# Patient Record
Sex: Female | Born: 1965
Health system: Southern US, Community
[De-identification: ages and names within clinical notes are randomized; demographics above are authoritative.]

## PROBLEM LIST (undated history)

## (undated) DIAGNOSIS — E559 Vitamin D deficiency, unspecified: Secondary | ICD-10-CM

## (undated) DIAGNOSIS — I10 Essential (primary) hypertension: Secondary | ICD-10-CM

## (undated) DIAGNOSIS — I2510 Atherosclerotic heart disease of native coronary artery without angina pectoris: Secondary | ICD-10-CM

## (undated) DIAGNOSIS — A64 Unspecified sexually transmitted disease: Secondary | ICD-10-CM

## (undated) DIAGNOSIS — E785 Hyperlipidemia, unspecified: Secondary | ICD-10-CM

## (undated) DIAGNOSIS — N189 Chronic kidney disease, unspecified: Secondary | ICD-10-CM

## (undated) DIAGNOSIS — I2584 Coronary atherosclerosis due to calcified coronary lesion: Secondary | ICD-10-CM

## (undated) HISTORY — PX: CARPAL TUNNEL RELEASE: SHX101

## (undated) HISTORY — DX: Vitamin D deficiency, unspecified: E55.9

## (undated) HISTORY — DX: Hyperlipidemia, unspecified: E78.5

## (undated) HISTORY — DX: Chronic kidney disease, unspecified: N18.9

## (undated) HISTORY — PX: WISDOM TOOTH EXTRACTION: SHX21

## (undated) HISTORY — DX: Coronary atherosclerosis due to calcified coronary lesion: I25.84

## (undated) HISTORY — PX: ABDOMINAL HYSTERECTOMY: SHX81

## (undated) HISTORY — DX: Atherosclerotic heart disease of native coronary artery without angina pectoris: I25.10

## (undated) HISTORY — PX: CHOLECYSTECTOMY: SHX55

---

## 1987-02-19 HISTORY — PX: DILATION AND CURETTAGE OF UTERUS: SHX78

## 1995-02-19 HISTORY — PX: TUBAL LIGATION: SHX77

## 2006-02-18 HISTORY — PX: COLONOSCOPY: SHX174

## 2016-08-15 DIAGNOSIS — S060X0A Concussion without loss of consciousness, initial encounter: Secondary | ICD-10-CM | POA: Insufficient documentation

## 2017-05-20 DIAGNOSIS — N390 Urinary tract infection, site not specified: Secondary | ICD-10-CM | POA: Diagnosis not present

## 2017-05-20 DIAGNOSIS — R300 Dysuria: Secondary | ICD-10-CM | POA: Diagnosis not present

## 2017-05-28 DIAGNOSIS — L700 Acne vulgaris: Secondary | ICD-10-CM | POA: Diagnosis not present

## 2017-05-28 DIAGNOSIS — Z411 Encounter for cosmetic surgery: Secondary | ICD-10-CM | POA: Diagnosis not present

## 2017-06-20 ENCOUNTER — Other Ambulatory Visit: Payer: Self-pay

## 2017-06-20 ENCOUNTER — Encounter (HOSPITAL_COMMUNITY): Payer: Self-pay | Admitting: Emergency Medicine

## 2017-06-20 ENCOUNTER — Emergency Department (HOSPITAL_COMMUNITY)
Admission: EM | Admit: 2017-06-20 | Discharge: 2017-06-20 | Disposition: A | Discharge status: Home / Self Care | Payer: Self-pay | Attending: Emergency Medicine | Admitting: Emergency Medicine

## 2017-06-20 DIAGNOSIS — F17210 Nicotine dependence, cigarettes, uncomplicated: Secondary | ICD-10-CM | POA: Insufficient documentation

## 2017-06-20 DIAGNOSIS — G5601 Carpal tunnel syndrome, right upper limb: Secondary | ICD-10-CM | POA: Insufficient documentation

## 2017-06-20 DIAGNOSIS — R200 Anesthesia of skin: Secondary | ICD-10-CM | POA: Insufficient documentation

## 2017-06-20 HISTORY — DX: Essential (primary) hypertension: I10

## 2017-06-20 MED ORDER — NAPROXEN 500 MG PO TABS: 500.0000 mg | ORAL_TABLET | Freq: Two times a day (BID) | ORAL | 0 refills | Status: DC

## 2017-06-20 MED ORDER — ACETAMINOPHEN 500 MG PO TABS: 500.0000 mg | ORAL_TABLET | Freq: Four times a day (QID) | ORAL | 0 refills | Status: DC | PRN

## 2017-06-20 NOTE — ED Notes (Signed)
Bed: WTR9 Expected date:  Expected time:  Means of arrival:  Comments: EMS-knee/ankle pain

## 2017-06-20 NOTE — Discharge Instructions (Signed)
Take Naprosyn twice daily for your pain.  You can also alternate with Tylenol as prescribed.  Use ice 3-4 times daily alternating 20 minutes on, 20 minutes off.  Wear the splint especially at night and while you are working.  Rest your hand is much as possible.  Please return to emergency department if you develop any new or worsening symptoms.  Please follow-up with the hand doctor, Dr. Mina Marble, for further evaluation and treatment.

## 2017-06-20 NOTE — ED Triage Notes (Signed)
Pt reports pain in r/hand , radiating up r/arm  X 5 days. Denies trauma. Also c/o r/leg pain

## 2017-06-20 NOTE — ED Provider Notes (Signed)
[facility]-EMERGENCY DEPT Provider Note   CSN: [id number] Arrival date & time: 06/20/17  1320     History   Chief Complaint Chief Complaint  Patient presents with  . Arm Pain    HPI Karen Mccall is a 52 y.o. female with history of hypertension who presents with a 1 to 2-week history of right hand and arm pain.  She reports it started off with tingling in her thumb through third finger, however she has had progressive pain to the area radiating up to her arm.  She reports she uses a drill constantly at work.  She is right-handed.  She has tried ibuprofen once without significant relief.  She denies any trauma.  She denies any other complaints.  HPI  Past Medical History:  Diagnosis Date  . Hypertension     There are no active problems to display for this patient.   Past Surgical History:  Procedure Laterality Date  . ABDOMINAL HYSTERECTOMY    . CHOLECYSTECTOMY       OB History   None      Home Medications    Prior to Admission medications   Medication Sig Start Date End Date Taking? Authorizing Provider  acetaminophen (TYLENOL) 500 MG tablet Take 1 tablet (500 mg total) by mouth every 6 (six) hours as needed. 06/20/17   Norine Reddington, Waylan Boga, PA-C  naproxen (NAPROSYN) 500 MG tablet Take 1 tablet (500 mg total) by mouth 2 (two) times daily. 06/20/17   Emi Holes, PA-C    Family History Family History  Problem Relation Age of Onset  . Hypertension Mother   . Diabetes Mother   . Cancer Mother     Social History Social History   Tobacco Use  . Smoking status: Current Every Day Smoker  Substance Use Topics  . Alcohol use: Never    Frequency: Never  . Drug use: Never     Allergies   Patient has no known allergies.   Review of Systems Review of Systems  Musculoskeletal: Positive for arthralgias and myalgias.  Neurological: Positive for numbness (paresthesia).     Physical Exam Updated Vital Signs BP 122/87 (BP Location: Left  Arm)   Pulse 84   Temp 98.2 F (36.8 C) (Oral)   Resp 16   Wt 54.4 kg (120 lb)   SpO2 100%   Physical Exam  Constitutional: She appears well-developed and well-nourished. No distress.  HENT:  Head: Normocephalic and atraumatic.  Mouth/Throat: Oropharynx is clear and moist. No oropharyngeal exudate.  Eyes: Pupils are equal, round, and reactive to light. Conjunctivae are normal. Right eye exhibits no discharge. Left eye exhibits no discharge. No scleral icterus.  Neck: Normal range of motion. Neck supple.  Cardiovascular: Normal rate, regular rhythm, normal heart sounds and intact distal pulses. Exam reveals no gallop and no friction rub.  No murmur heard. Pulmonary/Chest: Effort normal and breath sounds normal. No stridor. No respiratory distress. She has no wheezes. She has no rales.  Musculoskeletal: She exhibits no edema.  Tenderness in the right thenar eminence, paresthesia with light touch, some pain with palpation of the third through third digits into the elbow, full range of motion with flexion, extension, abduction, abduction of the hand, radial pulse intact, no bony elbow or shoulder tenderness  Neurological: She is alert. Coordination normal.  Skin: Skin is warm and dry. No rash noted. She is not diaphoretic. No pallor.  Psychiatric: She has a normal mood and affect.  Nursing note and vitals reviewed.  ED Treatments / Results  Labs (all labs ordered are listed, but only abnormal results are displayed) Labs Reviewed - No data to display  EKG None  Radiology No results found.  Procedures Procedures (including critical care time)  Medications Ordered in ED Medications - No data to display   Initial Impression / Assessment and Plan / ED Course  I have reviewed the triage vital signs and the nursing notes.  Pertinent labs & imaging results that were available during my care of the patient were reviewed by me and considered in my medical decision making (see  chart for details).     Patient with suspected carpal tunnel syndrome, most likely related to her repetitive motions using a drill at work.  No trauma or injury.  No indication for x-ray at this time.  Patient is right-handed.  Will place in cock-up wrist splint.  I advised supportive treatment including ice, NSAIDs and follow-up to hand surgery.  Advised to rest as much as possible.  Return precautions discussed.  Patient understands and agrees with plan.  Patient vitals stable throughout ED course and discharged in satisfactory condition.  Patient vitals stable throughout ED course and discharged in satisfactory condition peer  Final Clinical Impressions(s) / ED Diagnoses   Final diagnoses:  Carpal tunnel syndrome of right wrist    ED Discharge Orders        Ordered    naproxen (NAPROSYN) 500 MG tablet  2 times daily     06/20/17 1445    acetaminophen (TYLENOL) 500 MG tablet  Every 6 hours PRN     06/20/17 1445       Dane Bloch, Waylan Boga, PA-C 06/20/17 1453    Derwood Kaplan, MD 06/25/17 1759

## 2017-06-24 NOTE — ED Notes (Signed)
Writer spoke with Dr Rubin Payor regarding adding light duty/lifting to work not till Jul 10, 2017 when she has follow up appointment

## 2017-07-01 DIAGNOSIS — G5601 Carpal tunnel syndrome, right upper limb: Secondary | ICD-10-CM | POA: Insufficient documentation

## 2017-07-21 DIAGNOSIS — M79641 Pain in right hand: Secondary | ICD-10-CM | POA: Diagnosis not present

## 2017-07-30 DIAGNOSIS — R87610 Atypical squamous cells of undetermined significance on cytologic smear of cervix (ASC-US): Secondary | ICD-10-CM | POA: Diagnosis not present

## 2017-07-30 DIAGNOSIS — F17210 Nicotine dependence, cigarettes, uncomplicated: Secondary | ICD-10-CM | POA: Diagnosis not present

## 2017-07-30 DIAGNOSIS — Z1239 Encounter for other screening for malignant neoplasm of breast: Secondary | ICD-10-CM | POA: Diagnosis not present

## 2017-07-30 DIAGNOSIS — E559 Vitamin D deficiency, unspecified: Secondary | ICD-10-CM | POA: Diagnosis not present

## 2017-07-30 DIAGNOSIS — I10 Essential (primary) hypertension: Secondary | ICD-10-CM | POA: Diagnosis not present

## 2017-07-30 DIAGNOSIS — N3090 Cystitis, unspecified without hematuria: Secondary | ICD-10-CM | POA: Diagnosis not present

## 2017-07-30 DIAGNOSIS — E0590 Thyrotoxicosis, unspecified without thyrotoxic crisis or storm: Secondary | ICD-10-CM | POA: Diagnosis not present

## 2017-07-30 DIAGNOSIS — Z13220 Encounter for screening for lipoid disorders: Secondary | ICD-10-CM | POA: Diagnosis not present

## 2017-07-30 DIAGNOSIS — Z01419 Encounter for gynecological examination (general) (routine) without abnormal findings: Secondary | ICD-10-CM | POA: Diagnosis not present

## 2017-07-30 DIAGNOSIS — R809 Proteinuria, unspecified: Secondary | ICD-10-CM | POA: Diagnosis not present

## 2017-07-30 DIAGNOSIS — N3091 Cystitis, unspecified with hematuria: Secondary | ICD-10-CM | POA: Diagnosis not present

## 2017-08-06 DIAGNOSIS — M65331 Trigger finger, right middle finger: Secondary | ICD-10-CM | POA: Diagnosis not present

## 2017-08-06 DIAGNOSIS — M25541 Pain in joints of right hand: Secondary | ICD-10-CM | POA: Diagnosis not present

## 2017-08-06 DIAGNOSIS — G5601 Carpal tunnel syndrome, right upper limb: Secondary | ICD-10-CM | POA: Diagnosis not present

## 2017-09-01 DIAGNOSIS — G5601 Carpal tunnel syndrome, right upper limb: Secondary | ICD-10-CM | POA: Diagnosis not present

## 2017-09-03 DIAGNOSIS — M65341 Trigger finger, right ring finger: Secondary | ICD-10-CM | POA: Diagnosis not present

## 2017-09-03 DIAGNOSIS — G5601 Carpal tunnel syndrome, right upper limb: Secondary | ICD-10-CM | POA: Diagnosis not present

## 2017-09-17 DIAGNOSIS — M65341 Trigger finger, right ring finger: Secondary | ICD-10-CM | POA: Diagnosis not present

## 2017-09-17 DIAGNOSIS — G5601 Carpal tunnel syndrome, right upper limb: Secondary | ICD-10-CM | POA: Diagnosis not present

## 2017-10-15 ENCOUNTER — Ambulatory Visit (INDEPENDENT_AMBULATORY_CARE_PROVIDER_SITE_OTHER): Payer: BLUE CROSS/BLUE SHIELD | Admitting: Family Medicine

## 2017-10-15 ENCOUNTER — Encounter: Payer: Self-pay | Admitting: Family Medicine

## 2017-10-15 VITALS — BP 120/84 | HR 76 | Temp 98.8°F | Resp 16 | Ht 66.5 in | Wt 118.6 lb

## 2017-10-15 DIAGNOSIS — Z8639 Personal history of other endocrine, nutritional and metabolic disease: Secondary | ICD-10-CM

## 2017-10-15 DIAGNOSIS — Z9189 Other specified personal risk factors, not elsewhere classified: Secondary | ICD-10-CM

## 2017-10-15 DIAGNOSIS — N898 Other specified noninflammatory disorders of vagina: Secondary | ICD-10-CM

## 2017-10-15 DIAGNOSIS — B3731 Acute candidiasis of vulva and vagina: Secondary | ICD-10-CM

## 2017-10-15 DIAGNOSIS — Z7689 Persons encountering health services in other specified circumstances: Secondary | ICD-10-CM | POA: Diagnosis not present

## 2017-10-15 DIAGNOSIS — I10 Essential (primary) hypertension: Secondary | ICD-10-CM

## 2017-10-15 DIAGNOSIS — N760 Acute vaginitis: Secondary | ICD-10-CM

## 2017-10-15 DIAGNOSIS — B9689 Other specified bacterial agents as the cause of diseases classified elsewhere: Secondary | ICD-10-CM

## 2017-10-15 DIAGNOSIS — B373 Candidiasis of vulva and vagina: Secondary | ICD-10-CM

## 2017-10-15 LAB — POCT WET PREP (WET MOUNT)
Clue Cells Wet Prep Whiff POC: POSITIVE
TRICHOMONAS WET PREP HPF POC: ABSENT

## 2017-10-15 MED ORDER — FLUCONAZOLE 150 MG PO TABS: 150.0000 mg | ORAL_TABLET | Freq: Once | ORAL | 0 refills | Status: AC

## 2017-10-15 MED ORDER — METRONIDAZOLE 500 MG PO TABS: 500.0000 mg | ORAL_TABLET | Freq: Two times a day (BID) | ORAL | 0 refills | Status: DC

## 2017-10-15 NOTE — Patient Instructions (Signed)
Avoid alcohol with the metronidazole.   Schedule a fasting annual exam at your convenience.

## 2017-10-15 NOTE — Progress Notes (Signed)
   Subjective:    Patient ID: Karen Mccall, female    DOB: 08/31/1965, 52 y.o.   MRN: [id number]  HPI Chief Complaint  Patient presents with  . new patient    new pt, vaginal odor- 2-3 weeks, weight is not staying the same 2-3 weeks   She is new to the practice and here to establish care. [place] medical care: [place] [facility] [place].  States she [facility].   Last CPE: years ago  Complains of a 1-2 week history of vaginal odor with very little discharge and no itching. Denies fever, chills, chest pain, palpitations, shortness of breath, abdominal pain, back pain, N/V/D, urinary symptoms.   She has had new sexual partners. Would like STD testing  History of HTN. Has been stable on current medications. No side effects.   Other providers: None   Smoking 1ppd.  Denies alcohol or drug use.   Separated. 5 kids. Ages [age]. Works for [employer]  Reviewed allergies, medications, past medical, surgical, and social history.   Review of Systems Pertinent positives and negatives in the history of present illness.     Objective:   Physical Exam BP 120/84   Pulse 76   Temp 98.8 F (37.1 C) (Oral)   Resp 16   Ht 5' 6.5" (1.689 m)   Wt 118 lb 9.6 oz (53.8 kg)   SpO2 98%   BMI 18.86 kg/m   Alert and in no distress. Pharyngeal area is normal. Neck is supple without adenopathy or thyromegaly. Cardiac exam shows a regular sinus rhythm without murmurs or gallops. Lungs are clear to auscultation. Abdomen soft, non distended, normal BS, non tender, no palpable masses. Normal external genitalia. White-thick adherent vaginal discharge. Internal exam not done. Skin is warm and dry, no pallor or rash.      Assessment & Plan:  Vaginal odor - Plan: POCT Wet Prep (Wet Mount), GC/Chlamydia Probe Amp, RPR, HIV antibody  Vaginal discharge - Plan: POCT Wet Prep (Wet Mount), GC/Chlamydia Probe Amp, RPR, HIV  antibody  Encounter to establish care  Essential hypertension - Plan: CBC with Differential/Platelet, Comprehensive metabolic panel  History of thyroid disease - Plan: TSH, T4, free, T3  At risk for sexually transmitted disease due to unprotected sex - Plan: GC/Chlamydia Probe Amp, RPR, HIV antibody  BV (bacterial vaginosis) - Plan: metroNIDAZOLE (FLAGYL) 500 MG tablet  Vaginal candida - Plan: fluconazole (DIFLUCAN) 150 MG tablet  Wet mount +BV, yeast and neg trich Treat with metronidazole and diflucan. Avoid alcohol with antibiotic.  STD testing done.  HTN- controlled. Continue medications.  Labs done including thyroid panel due to patient's history.  Follow up pending labs and for a fasting CPE at her convenience. Request medical records.

## 2017-10-16 LAB — COMPREHENSIVE METABOLIC PANEL
ALBUMIN: 4.5 g/dL (ref 3.5–5.5)
ALK PHOS: 94 IU/L (ref 39–117)
ALT: 6 IU/L (ref 0–32)
AST: 10 IU/L (ref 0–40)
Albumin/Globulin Ratio: 1.6 (ref 1.2–2.2)
BUN / CREAT RATIO: 13 (ref 9–23)
BUN: 20 mg/dL (ref 6–24)
Bilirubin Total: 0.5 mg/dL (ref 0.0–1.2)
CALCIUM: 9.9 mg/dL (ref 8.7–10.2)
CO2: 18 mmol/L — AB (ref 20–29)
CREATININE: 1.58 mg/dL — AB (ref 0.57–1.00)
Chloride: 105 mmol/L (ref 96–106)
GFR calc Af Amer: 43 mL/min/{1.73_m2} — ABNORMAL LOW (ref >59)
GFR, EST NON AFRICAN AMERICAN: 38 mL/min/{1.73_m2} — AB (ref >59)
GLUCOSE: 76 mg/dL (ref 65–99)
Globulin, Total: 2.9 g/dL (ref 1.5–4.5)
Potassium: 4.1 mmol/L (ref 3.5–5.2)
Sodium: 139 mmol/L (ref 134–144)
Total Protein: 7.4 g/dL (ref 6.0–8.5)

## 2017-10-16 LAB — CBC WITH DIFFERENTIAL/PLATELET
BASOS ABS: 0.1 10*3/uL (ref 0.0–0.2)
Basos: 1 %
EOS (ABSOLUTE): 0 10*3/uL (ref 0.0–0.4)
EOS: 1 %
HEMOGLOBIN: 14.3 g/dL (ref 11.1–15.9)
Hematocrit: 43.8 % (ref 34.0–46.6)
IMMATURE GRANS (ABS): 0 10*3/uL (ref 0.0–0.1)
IMMATURE GRANULOCYTES: 0 %
LYMPHS ABS: 2.8 10*3/uL (ref 0.7–3.1)
Lymphs: 38 %
MCH: 30.1 pg (ref 26.6–33.0)
MCHC: 32.6 g/dL (ref 31.5–35.7)
MCV: 92 fL (ref 79–97)
MONOCYTES: 6 %
Monocytes Absolute: 0.5 10*3/uL (ref 0.1–0.9)
Neutrophils Absolute: 3.9 10*3/uL (ref 1.4–7.0)
Neutrophils: 54 %
Platelets: 239 10*3/uL (ref 150–450)
RBC: 4.75 x10E6/uL (ref 3.77–5.28)
RDW: 13 % (ref 12.3–15.4)
WBC: 7.3 10*3/uL (ref 3.4–10.8)

## 2017-10-16 LAB — T4, FREE: FREE T4: 1.07 ng/dL (ref 0.82–1.77)

## 2017-10-16 LAB — HIV ANTIBODY (ROUTINE TESTING W REFLEX): HIV Screen 4th Generation wRfx: NONREACTIVE

## 2017-10-16 LAB — T3: T3, Total: 109 ng/dL (ref 71–180)

## 2017-10-16 LAB — RPR: RPR: NONREACTIVE

## 2017-10-16 LAB — TSH: TSH: 1.51 u[IU]/mL (ref 0.450–4.500)

## 2017-10-17 LAB — GC/CHLAMYDIA PROBE AMP
Chlamydia trachomatis, NAA: NEGATIVE
Neisseria gonorrhoeae by PCR: NEGATIVE

## 2017-10-24 ENCOUNTER — Ambulatory Visit: Payer: BLUE CROSS/BLUE SHIELD | Admitting: Family Medicine

## 2017-11-07 ENCOUNTER — Telehealth: Payer: Self-pay | Admitting: Family Medicine

## 2017-11-07 NOTE — Telephone Encounter (Signed)
Received requested records from [facility]. Included are notes, labs, pap, mammogram and EKG. Sending back for review.

## 2017-11-19 ENCOUNTER — Ambulatory Visit: Payer: BLUE CROSS/BLUE SHIELD | Admitting: Family Medicine

## 2017-11-19 ENCOUNTER — Encounter: Payer: Self-pay | Admitting: Family Medicine

## 2017-11-19 ENCOUNTER — Encounter: Payer: Self-pay | Admitting: Gastroenterology

## 2017-11-19 ENCOUNTER — Ambulatory Visit: Payer: Self-pay | Admitting: Family Medicine

## 2017-11-19 VITALS — BP 110/70 | HR 87 | Temp 98.6°F | Resp 16 | Wt 118.0 lb

## 2017-11-19 DIAGNOSIS — N189 Chronic kidney disease, unspecified: Secondary | ICD-10-CM

## 2017-11-19 DIAGNOSIS — R102 Pelvic and perineal pain: Secondary | ICD-10-CM

## 2017-11-19 DIAGNOSIS — N3001 Acute cystitis with hematuria: Secondary | ICD-10-CM | POA: Diagnosis not present

## 2017-11-19 DIAGNOSIS — Z1211 Encounter for screening for malignant neoplasm of colon: Secondary | ICD-10-CM

## 2017-11-19 DIAGNOSIS — R809 Proteinuria, unspecified: Secondary | ICD-10-CM | POA: Insufficient documentation

## 2017-11-19 DIAGNOSIS — Z113 Encounter for screening for infections with a predominantly sexual mode of transmission: Secondary | ICD-10-CM

## 2017-11-19 DIAGNOSIS — R300 Dysuria: Secondary | ICD-10-CM | POA: Diagnosis not present

## 2017-11-19 DIAGNOSIS — N184 Chronic kidney disease, stage 4 (severe): Secondary | ICD-10-CM | POA: Insufficient documentation

## 2017-11-19 HISTORY — DX: Chronic kidney disease, unspecified: N18.9

## 2017-11-19 LAB — POCT URINALYSIS DIP (PROADVANTAGE DEVICE)
BILIRUBIN UA: NEGATIVE
BILIRUBIN UA: NEGATIVE mg/dL
GLUCOSE UA: NEGATIVE mg/dL
Leukocytes, UA: NEGATIVE
Nitrite, UA: NEGATIVE
PH UA: 6 (ref 5.0–8.0)
Protein Ur, POC: 100 mg/dL — AB
Specific Gravity, Urine: 1.02
Urobilinogen, Ur: NEGATIVE

## 2017-11-19 MED ORDER — SULFAMETHOXAZOLE-TRIMETHOPRIM 800-160 MG PO TABS: 1.0000 | ORAL_TABLET | Freq: Two times a day (BID) | ORAL | 0 refills | Status: DC

## 2017-11-19 NOTE — Progress Notes (Signed)
   Subjective:    Patient ID: Karen Mccall, female    DOB: [birth year], 52 y.o.   MRN: [id number]  HPI Chief Complaint  Patient presents with  . blood in stool    blood in stool. and burning to urination- last week   She is here with complaints of 1 week history of dysuria. Symptoms are severe per patient. Last UTI years ago.  She also reports some suprapubic pressure with urination. No back pain.  Denies fever, chills, dizziness, chest pain, back pain, N/V/D.   No vaginal discharge. States her boyfriend told her he has "bladder stones" and is having to be treated with antibiotics for this. States he also told her he was told he should not have sex for 2 weeks. States she would like STD testing.   States she had lower abdominal cramping last week for a few minutes and then had one loose watery stool and noticed bright red blood on the toilet tissue. States her bowel movements have been normal since and no more blood.  States she had a colonoscopy 10 years ago and it was normal.  She is due for screening colonoscopy. Needs referral.   Hysterectomy in past.   Has an appointment later this month for CKD.   Reviewed allergies, medications, past medical, surgical, family, and social history.   Review of Systems Pertinent positives and negatives in the history of present illness.     Objective:   Physical Exam BP 110/70   Pulse 87   Temp 98.6 F (37 C) (Oral)   Resp 16   Wt 118 lb (53.5 kg)   SpO2 99%   BMI 18.76 kg/m   Alert and in no distress. Tympanic membranes and canals are normal. Pharyngeal area is normal. Neck is supple without adenopathy or thyromegaly. Cardiac exam shows a regular sinus rhythm without murmurs or gallops. Lungs are clear to auscultation. Abdomen is soft, non distended, suprapubic tenderness without rebound, non tender otherwise, normal BS, no palpable masses. No CVAT. Extremities without edema, pulses intact. Skin is warm and dry, no pallor.     Urinalysis dipstick: blood 1+, protein 2+     Assessment & Plan:  Acute cystitis with hematuria - Plan: sulfamethoxazole-trimethoprim (BACTRIM DS,SEPTRA DS) 800-160 MG tablet  Burning with urination - Plan: POCT Urinalysis DIP (Proadvantage Device)  Dysuria - Plan: Urine Culture  Screen for STD (sexually transmitted disease) - Plan: GC/Chlamydia Probe Amp, Trichomonas vaginalis, RNA  Screen for colon cancer - Plan: Ambulatory referral to Gastroenterology  Suprapubic pressure - Plan: Urine Culture  Proteinuria, unspecified type  Chronic kidney disease, unspecified CKD stage  Treat with Bactrim and send urine for culture.  Increase water and she may take AZO for 1-2 days and then stop.  STD testing done per request.  She has an appointment with [facility] later this month for CKD and proteinuria.  One episode of watery stool with a scant amount of BRB on tissue, none since. Rectal was not done. She is overdue for screening colonoscopy. Last one was out of town. No record. Will refer to GI.  Follow up in 2 weeks for UTI symptoms and have a repeat UA.

## 2017-11-19 NOTE — Patient Instructions (Signed)
Take the antibiotic as prescribed. Drink plenty of water.  Take AZO over the counter for today and tomorrow if you would like for pain.   Follow up in 2 weeks.

## 2017-11-20 ENCOUNTER — Telehealth: Payer: Self-pay

## 2017-11-20 LAB — URINE CULTURE: ORGANISM ID, BACTERIA: NO GROWTH

## 2017-11-20 NOTE — Telephone Encounter (Signed)
Pt called and stated after taking Bactrim she has has a terrible headache and wants to know if there is something else she can take. Please advise.

## 2017-11-20 NOTE — Telephone Encounter (Signed)
I recommend taking Tylenol or ibuprofen (if not allergic) and drinking more water to see if her headache improves. If it does not by tomorrow then call us back. I do not think this is from the antibiotic but if she still wants to switch tomorrow then we can.

## 2017-11-20 NOTE — Telephone Encounter (Signed)
Pt was notified.  

## 2017-11-22 LAB — GC/CHLAMYDIA PROBE AMP
CHLAMYDIA, DNA PROBE: NEGATIVE
NEISSERIA GONORRHOEAE BY PCR: POSITIVE — AB

## 2017-11-22 LAB — TRICHOMONAS VAGINALIS, PROBE AMP: Trich vag by NAA: NEGATIVE

## 2017-11-24 ENCOUNTER — Encounter: Payer: Self-pay | Admitting: Family Medicine

## 2017-11-24 ENCOUNTER — Ambulatory Visit (INDEPENDENT_AMBULATORY_CARE_PROVIDER_SITE_OTHER): Payer: Self-pay | Admitting: Family Medicine

## 2017-11-24 VITALS — BP 140/80 | HR 79 | Temp 98.9°F

## 2017-11-24 DIAGNOSIS — A549 Gonococcal infection, unspecified: Secondary | ICD-10-CM | POA: Diagnosis not present

## 2017-11-24 MED ORDER — AZITHROMYCIN 250 MG PO TABS: ORAL_TABLET | ORAL | 0 refills | Status: DC

## 2017-11-24 MED ORDER — CEFTRIAXONE SODIUM 250 MG IJ SOLR
250.0000 mg | Freq: Once | INTRAMUSCULAR | Status: AC
  Administered 2017-11-24: 250 mg via INTRAMUSCULAR

## 2017-11-24 NOTE — Progress Notes (Signed)
   Subjective:    Patient ID: Karen Mccall, female    DOB: 1965/04/25, 52 y.o.   MRN: [id number]  HPI Chief Complaint  Patient presents with  . follow-up    positive STD- gonerrhea, still haing burning with urination, stomach cramping- constantly   She is here to follow up on positive gonorrhea result. She was negative for UTI.  Continues having suprapubic pressure and dysuria.   Denies fever, chills,  N/V/D.   Reviewed allergies, medications, past medical, surgical, family, and social history.    Review of Systems Pertinent positives and negatives in the history of present illness.     Objective:   Physical Exam BP 140/80   Pulse 79   Temp 98.9 F (37.2 C) (Oral)   Alert and oriented and in no acute distress. Not otherwise examined.       Assessment & Plan:  Gonorrhea - Plan: azithromycin (ZITHROMAX) 250 MG tablet, RPR, HIV Antibody (routine testing w rflx), cefTRIAXone (ROCEPHIN) injection 250 mg  Rocephin 250 mg IM given in office. Prescribed azithromycin 1G as recommended. Advised to avoid sex for the next 1-2 weeks and to use protection for safe sex in the future. Will check HIV and RPR. Negative for UTI at previous visit. She will follow up if symptoms do not fully resolve.

## 2017-11-25 LAB — HIV ANTIBODY (ROUTINE TESTING W REFLEX): HIV Screen 4th Generation wRfx: NONREACTIVE

## 2017-11-25 LAB — RPR: RPR Ser Ql: NONREACTIVE

## 2017-11-26 ENCOUNTER — Encounter (HOSPITAL_COMMUNITY): Payer: Self-pay | Admitting: Emergency Medicine

## 2017-11-26 ENCOUNTER — Other Ambulatory Visit: Payer: Self-pay

## 2017-11-26 ENCOUNTER — Emergency Department (HOSPITAL_COMMUNITY): Payer: Self-pay

## 2017-11-26 ENCOUNTER — Emergency Department (HOSPITAL_COMMUNITY)
Admission: EM | Admit: 2017-11-26 | Discharge: 2017-11-26 | Disposition: A | Discharge status: Home / Self Care | Payer: Self-pay | Attending: Emergency Medicine | Admitting: Emergency Medicine

## 2017-11-26 DIAGNOSIS — N189 Chronic kidney disease, unspecified: Secondary | ICD-10-CM | POA: Insufficient documentation

## 2017-11-26 DIAGNOSIS — Z79899 Other long term (current) drug therapy: Secondary | ICD-10-CM | POA: Insufficient documentation

## 2017-11-26 DIAGNOSIS — I129 Hypertensive chronic kidney disease with stage 1 through stage 4 chronic kidney disease, or unspecified chronic kidney disease: Secondary | ICD-10-CM | POA: Insufficient documentation

## 2017-11-26 DIAGNOSIS — R197 Diarrhea, unspecified: Secondary | ICD-10-CM

## 2017-11-26 DIAGNOSIS — F17210 Nicotine dependence, cigarettes, uncomplicated: Secondary | ICD-10-CM | POA: Insufficient documentation

## 2017-11-26 DIAGNOSIS — N3090 Cystitis, unspecified without hematuria: Secondary | ICD-10-CM | POA: Insufficient documentation

## 2017-11-26 DIAGNOSIS — R112 Nausea with vomiting, unspecified: Secondary | ICD-10-CM | POA: Insufficient documentation

## 2017-11-26 LAB — URINALYSIS, ROUTINE W REFLEX MICROSCOPIC
BILIRUBIN URINE: NEGATIVE
GLUCOSE, UA: NEGATIVE mg/dL
KETONES UR: NEGATIVE mg/dL
NITRITE: NEGATIVE
Specific Gravity, Urine: 1.013 (ref 1.005–1.030)
pH: 6 (ref 5.0–8.0)

## 2017-11-26 LAB — COMPREHENSIVE METABOLIC PANEL
ALBUMIN: 3.9 g/dL (ref 3.5–5.0)
ALK PHOS: 65 U/L (ref 38–126)
ALT: 10 U/L (ref 0–44)
ANION GAP: 9 (ref 5–15)
AST: 19 U/L (ref 15–41)
BUN: 16 mg/dL (ref 6–20)
CHLORIDE: 110 mmol/L (ref 98–111)
CO2: 22 mmol/L (ref 22–32)
Calcium: 9 mg/dL (ref 8.9–10.3)
Creatinine, Ser: 1.46 mg/dL — ABNORMAL HIGH (ref 0.44–1.00)
GFR calc Af Amer: 47 mL/min — ABNORMAL LOW (ref >60)
GFR calc non Af Amer: 41 mL/min — ABNORMAL LOW (ref >60)
Glucose, Bld: 103 mg/dL — ABNORMAL HIGH (ref 70–99)
POTASSIUM: 4.8 mmol/L (ref 3.5–5.1)
SODIUM: 141 mmol/L (ref 135–145)
TOTAL PROTEIN: 7.1 g/dL (ref 6.5–8.1)
Total Bilirubin: 1.3 mg/dL — ABNORMAL HIGH (ref 0.3–1.2)

## 2017-11-26 LAB — CBC
HEMATOCRIT: 40.6 % (ref 36.0–46.0)
HEMOGLOBIN: 12.9 g/dL (ref 12.0–15.0)
MCH: 30.7 pg (ref 26.0–34.0)
MCHC: 31.8 g/dL (ref 30.0–36.0)
MCV: 96.7 fL (ref 80.0–100.0)
NRBC: 0 % (ref 0.0–0.2)
Platelets: 176 10*3/uL (ref 150–400)
RBC: 4.2 MIL/uL (ref 3.87–5.11)
RDW: 13.3 % (ref 11.5–15.5)
WBC: 5.6 10*3/uL (ref 4.0–10.5)

## 2017-11-26 LAB — LIPASE, BLOOD: LIPASE: 55 U/L — AB (ref 11–51)

## 2017-11-26 MED ORDER — SODIUM CHLORIDE 0.9 % IV BOLUS
1000.0000 mL | Freq: Once | INTRAVENOUS | Status: AC
  Administered 2017-11-26: 1000 mL via INTRAVENOUS

## 2017-11-26 MED ORDER — IOHEXOL 300 MG/ML  SOLN
75.0000 mL | Freq: Once | INTRAMUSCULAR | Status: AC | PRN
  Administered 2017-11-26: 75 mL via INTRAVENOUS

## 2017-11-26 MED ORDER — KETOROLAC TROMETHAMINE 30 MG/ML IJ SOLN
30.0000 mg | Freq: Once | INTRAMUSCULAR | Status: AC
  Administered 2017-11-26: 30 mg via INTRAVENOUS
  Filled 2017-11-26: qty 1

## 2017-11-26 MED ORDER — HYDROCODONE-ACETAMINOPHEN 5-325 MG PO TABS: 1.0000 | ORAL_TABLET | ORAL | 0 refills | Status: DC | PRN

## 2017-11-26 MED ORDER — PROBIOTIC & ACIDOPHILUS EX ST PO CAPS: 1.0000 | ORAL_CAPSULE | Freq: Every day | ORAL | 0 refills | Status: DC

## 2017-11-26 MED ORDER — SULFAMETHOXAZOLE-TRIMETHOPRIM 800-160 MG PO TABS
1.0000 | ORAL_TABLET | Freq: Two times a day (BID) | ORAL | 0 refills | Status: AC
Start: 2017-11-26 — End: 2017-12-03

## 2017-11-26 MED ORDER — SULFAMETHOXAZOLE-TRIMETHOPRIM 800-160 MG PO TABS
1.0000 | ORAL_TABLET | Freq: Once | ORAL | Status: AC
  Administered 2017-11-26: 1 via ORAL
  Filled 2017-11-26: qty 1

## 2017-11-26 MED ORDER — ONDANSETRON HCL 4 MG/2ML IJ SOLN
4.0000 mg | Freq: Once | INTRAMUSCULAR | Status: AC
  Administered 2017-11-26: 4 mg via INTRAVENOUS
  Filled 2017-11-26: qty 2

## 2017-11-26 MED ORDER — ONDANSETRON 4 MG PO TBDP: 4.0000 mg | ORAL_TABLET | Freq: Three times a day (TID) | ORAL | 0 refills | Status: DC | PRN

## 2017-11-26 NOTE — ED Provider Notes (Signed)
[facility]-EMERGENCY DEPT Provider Note   CSN: [id number] Arrival date & time: 11/26/17  1446     History   Chief Complaint Chief Complaint  Patient presents with  . Flu Like Illness    HPI Karen Mccall is a 52 y.o. female.  Pt presents to the ED today with diarrhea and nausea for the past several days.  She was diagnosed with gonorrhea by her pcp and was given rocephin and zithromax on 10/7.  Pt denies any fevers, but has not had any appetite.     Past Medical History:  Diagnosis Date  . CKD (chronic kidney disease) 11/19/2017  . Hypertension     Patient Active Problem List   Diagnosis Date Noted  . Proteinuria 11/19/2017  . CKD (chronic kidney disease) 11/19/2017  . Essential hypertension 10/15/2017  . History of thyroid disease 10/15/2017    Past Surgical History:  Procedure Laterality Date  . ABDOMINAL HYSTERECTOMY    . CARPAL TUNNEL RELEASE     in [place]  . CHOLECYSTECTOMY       OB History   None      Home Medications    Prior to Admission medications   Medication Sig Start Date End Date Taking? Authorizing Provider  amLODipine (NORVASC) 10 MG tablet Take 10 mg by mouth daily.  09/30/17  Yes [provider]  losartan (COZAAR) 100 MG tablet Take 100 mg by mouth daily.  09/13/17  Yes [provider]  azithromycin (ZITHROMAX) 250 MG tablet Take 4 tablets (1 gram) x 1 dose. Patient not taking: Reported on 11/26/2017 11/24/17   Hetty Blend L, NP-C  HYDROcodone-acetaminophen (NORCO/VICODIN) 5-325 MG tablet Take 1 tablet by mouth every 4 (four) hours as needed. 11/26/17   Jacalyn Lefevre, MD  ondansetron (ZOFRAN ODT) 4 MG disintegrating tablet Take 1 tablet (4 mg total) by mouth every 8 (eight) hours as needed. 11/26/17   Jacalyn Lefevre, MD  Probiotic Product (PROBIOTIC & ACIDOPHILUS EX ST) CAPS Take 1 capsule by mouth daily. 11/26/17   Jacalyn Lefevre, MD  sulfamethoxazole-trimethoprim (BACTRIM DS,SEPTRA DS) 800-160  MG tablet Take 1 tablet by mouth 2 (two) times daily for 7 days. 11/26/17 12/03/17  Jacalyn Lefevre, MD    Family History Family History  Problem Relation Age of Onset  . Hypertension Mother   . Diabetes Mother   . Cancer Mother     Social History Social History   Tobacco Use  . Smoking status: Current Every Day Smoker    Packs/day: 0.75    Years: 33.00    Pack years: 24.75  . Smokeless tobacco: Never Used  Substance Use Topics  . Alcohol use: Never    Frequency: Never  . Drug use: Never     Allergies   Patient has no known allergies.   Review of Systems Review of Systems  Gastrointestinal: Positive for diarrhea, nausea and vomiting.  All other systems reviewed and are negative.    Physical Exam Updated Vital Signs BP (!) 145/107 (BP Location: Right Arm)   Pulse 78   Temp 98.8 F (37.1 C) (Oral)   Resp 18   SpO2 100%   Physical Exam  Constitutional: She is oriented to person, place, and time. She appears well-developed and well-nourished.  HENT:  Head: Normocephalic and atraumatic.  Right Ear: External ear normal.  Left Ear: External ear normal.  Nose: Nose normal.  Mouth/Throat: Oropharynx is clear and moist.  Eyes: Pupils are equal, round, and reactive to light. Conjunctivae and EOM  are normal.  Neck: Normal range of motion. Neck supple.  Cardiovascular: Normal rate, regular rhythm, normal heart sounds and intact distal pulses.  Pulmonary/Chest: Effort normal and breath sounds normal.  Abdominal: Soft. Bowel sounds are normal.  Musculoskeletal: Normal range of motion.  Neurological: She is alert and oriented to person, place, and time.  Skin: Skin is warm. Capillary refill takes less than 2 seconds.  Psychiatric: She has a normal mood and affect. Her behavior is normal. Judgment and thought content normal.  Nursing note and vitals reviewed.    ED Treatments / Results  Labs (all labs ordered are listed, but only abnormal results are  displayed) Labs Reviewed  LIPASE, BLOOD - Abnormal; Notable for the following components:      Result Value   Lipase 55 (*)    All other components within normal limits  COMPREHENSIVE METABOLIC PANEL - Abnormal; Notable for the following components:   Glucose, Bld 103 (*)    Creatinine, Ser 1.46 (*)    Total Bilirubin 1.3 (*)    GFR calc non Af Amer 41 (*)    GFR calc Af Amer 47 (*)    All other components within normal limits  URINALYSIS, ROUTINE W REFLEX MICROSCOPIC - Abnormal; Notable for the following components:   Hgb urine dipstick SMALL (*)    Protein, ur >=300 (*)    Leukocytes, UA TRACE (*)    Bacteria, UA RARE (*)    All other components within normal limits  CBC    EKG None  Radiology Ct Abdomen Pelvis W Contrast  Result Date: 11/26/2017 CLINICAL DATA:  Lower abdominal pain with diarrhea and weight loss for 10 days. EXAM: CT ABDOMEN AND PELVIS WITH CONTRAST TECHNIQUE: Multidetector CT imaging of the abdomen and pelvis was performed using the standard protocol following bolus administration of intravenous contrast. CONTRAST:  75mL OMNIPAQUE IOHEXOL 300 MG/ML  SOLN COMPARISON:  None. FINDINGS: Lower chest: No acute abnormality. Hepatobiliary: 7 mm hypodensity in the right hepatic lobe likely to represent a small cyst or hemangioma. No biliary dilatation or enhancing mass lesions. The patient is status post cholecystectomy. Pancreas: Normal Spleen: Normal Adrenals/Urinary Tract: Normal bilateral adrenal glands. Tiny too small to characterize exophytic hypodensity off the upper pole of the right kidney is identified measuring 6 mm, statistically consistent with a cyst. No hydroureteronephrosis. Urinary bladder is somewhat thick-walled in appearance circumferentially raising suspicion for cystitis. Stomach/Bowel: Stomach is within normal limits. Appendix appears normal. No evidence of bowel wall thickening, distention, or inflammatory changes. Vascular/Lymphatic: Moderate aortoiliac  atherosclerosis without aneurysm. No lymphadenopathy by CT size criteria. Reproductive: Hysterectomy.  No adnexal mass. Other: Small periumbilical fat containing hernia. No free air nor free fluid. Musculoskeletal: No aggressive osseous lesions or fracture. IMPRESSION: 1. Circumferentially thickened appearance of the urinary bladder question cystitis. 2. Subcentimeter hypodensities in the right hepatic lobe and right kidney statistically consistent with cysts. 3. No bowel obstruction, mural thickening or inflammatory change to explain the patient's diarrhea. Electronically Signed   By: Tollie Eth M.D.   On: 11/26/2017 19:26    Procedures Procedures (including critical care time)  Medications Ordered in ED Medications  sulfamethoxazole-trimethoprim (BACTRIM DS,SEPTRA DS) 800-160 MG per tablet 1 tablet (has no administration in time range)  sodium chloride 0.9 % bolus 1,000 mL (1,000 mLs Intravenous New Bag/Given 11/26/17 1822)  ondansetron (ZOFRAN) injection 4 mg (4 mg Intravenous Given 11/26/17 1821)  ketorolac (TORADOL) 30 MG/ML injection 30 mg (30 mg Intravenous Given 11/26/17 1823)  iohexol (OMNIPAQUE) 300  MG/ML solution 75 mL (75 mLs Intravenous Contrast Given 11/26/17 1905)     Initial Impression / Assessment and Plan / ED Course  I have reviewed the triage vital signs and the nursing notes.  Pertinent labs & imaging results that were available during my care of the patient were reviewed by me and considered in my medical decision making (see chart for details).     Pt is feeling much better after IVFs.  She does have thickening of the urinary bladder and dysuria, so she will be treated for cystitis.  The pt knows to return if worse and to f/u with pcp.  Final Clinical Impressions(s) / ED Diagnoses   Final diagnoses:  Cystitis  Diarrhea, unspecified type  Non-intractable vomiting with nausea, unspecified vomiting type    ED Discharge Orders         Ordered    Probiotic Product  (PROBIOTIC & ACIDOPHILUS EX ST) CAPS  Daily     11/26/17 1935    sulfamethoxazole-trimethoprim (BACTRIM DS,SEPTRA DS) 800-160 MG tablet  2 times daily     11/26/17 1935    HYDROcodone-acetaminophen (NORCO/VICODIN) 5-325 MG tablet  Every 4 hours PRN,   Status:  Discontinued     11/26/17 1935    ondansetron (ZOFRAN ODT) 4 MG disintegrating tablet  Every 8 hours PRN     11/26/17 1935    HYDROcodone-acetaminophen (NORCO/VICODIN) 5-325 MG tablet  Every 4 hours PRN     11/26/17 1936           Jacalyn Lefevre, MD 11/26/17 1939

## 2017-11-26 NOTE — ED Triage Notes (Signed)
Ongoing diarrhea, headache, nausea, and poor appetite for 10 days.

## 2017-12-17 ENCOUNTER — Telehealth: Payer: Self-pay

## 2017-12-17 NOTE — Telephone Encounter (Signed)
No show for previsit today. Left a message to call back before 5 today to rs previsit, in order to keep her procedure appointment.

## 2017-12-17 NOTE — Telephone Encounter (Signed)
Called pt to reschedule previsit. Pt wanted to reschedule previsit on 12-29-17 and she will call to set up her procedure next week.

## 2017-12-17 NOTE — Telephone Encounter (Signed)
Left a voicemail for pt to call and reschedule her previsit and procedure appointment. Letter sent. Pt was a no show today.

## 2017-12-29 ENCOUNTER — Encounter: Payer: Self-pay | Admitting: Gastroenterology

## 2018-01-06 ENCOUNTER — Encounter (HOSPITAL_COMMUNITY): Payer: Self-pay

## 2018-01-06 ENCOUNTER — Other Ambulatory Visit: Payer: Self-pay

## 2018-01-06 ENCOUNTER — Emergency Department (HOSPITAL_COMMUNITY)
Admission: EM | Admit: 2018-01-06 | Discharge: 2018-01-06 | Disposition: A | Discharge status: Home / Self Care | Payer: Self-pay | Attending: Emergency Medicine | Admitting: Emergency Medicine

## 2018-01-06 DIAGNOSIS — N189 Chronic kidney disease, unspecified: Secondary | ICD-10-CM | POA: Insufficient documentation

## 2018-01-06 DIAGNOSIS — Z202 Contact with and (suspected) exposure to infections with a predominantly sexual mode of transmission: Secondary | ICD-10-CM | POA: Insufficient documentation

## 2018-01-06 DIAGNOSIS — F17200 Nicotine dependence, unspecified, uncomplicated: Secondary | ICD-10-CM | POA: Insufficient documentation

## 2018-01-06 DIAGNOSIS — I129 Hypertensive chronic kidney disease with stage 1 through stage 4 chronic kidney disease, or unspecified chronic kidney disease: Secondary | ICD-10-CM | POA: Insufficient documentation

## 2018-01-06 LAB — URINALYSIS, ROUTINE W REFLEX MICROSCOPIC
Bacteria, UA: NONE SEEN
Bilirubin Urine: NEGATIVE
GLUCOSE, UA: NEGATIVE mg/dL
Ketones, ur: NEGATIVE mg/dL
NITRITE: NEGATIVE
PH: 5 (ref 5.0–8.0)
Protein, ur: 100 mg/dL — AB
Specific Gravity, Urine: 1.008 (ref 1.005–1.030)

## 2018-01-06 MED ORDER — CEFTRIAXONE SODIUM 250 MG IJ SOLR
250.0000 mg | Freq: Once | INTRAMUSCULAR | Status: AC
  Administered 2018-01-06: 250 mg via INTRAMUSCULAR
  Filled 2018-01-06: qty 250

## 2018-01-06 MED ORDER — LIDOCAINE HCL 1 % IJ SOLN
INTRAMUSCULAR | Status: AC
  Administered 2018-01-06: 20 mL
  Filled 2018-01-06: qty 20

## 2018-01-06 MED ORDER — AZITHROMYCIN 250 MG PO TABS
1000.0000 mg | ORAL_TABLET | Freq: Once | ORAL | Status: AC
  Administered 2018-01-06: 1000 mg via ORAL
  Filled 2018-01-06: qty 4

## 2018-01-06 NOTE — ED Provider Notes (Signed)
[facility]-EMERGENCY DEPT Provider Note   CSN: [id number] Arrival date & time: 01/06/18  1343     History   Chief Complaint Chief Complaint  Patient presents with  . Exposure to STD    HPI Jailene Cupit is a 52 y.o. female.  Moani Weipert is a 52 y.o. Female with a history of hypertension and CKD, who presents for evaluation after STD exposure.  Patient reports she was called by a partner today and told that he was tested and treated for gonorrhea.  She has not noted any vaginal discharge or pelvic discomfort.  No vaginal lesions or bumps.  No rashes.  She has not had any abdominal pain, nausea, vomiting or fevers.  Denies any dysuria or urinary frequency.  Reports history of gonorrhea in the past, but no other history of STD infections.     Past Medical History:  Diagnosis Date  . CKD (chronic kidney disease) 11/19/2017  . Hypertension     Patient Active Problem List   Diagnosis Date Noted  . Proteinuria 11/19/2017  . CKD (chronic kidney disease) 11/19/2017  . Essential hypertension 10/15/2017  . History of thyroid disease 10/15/2017    Past Surgical History:  Procedure Laterality Date  . ABDOMINAL HYSTERECTOMY    . CARPAL TUNNEL RELEASE     in [place]  . CHOLECYSTECTOMY       OB History   None      Home Medications    Prior to Admission medications   Medication Sig Start Date End Date Taking? Authorizing Provider  amLODipine (NORVASC) 10 MG tablet Take 10 mg by mouth daily.  09/30/17   [provider]  azithromycin (ZITHROMAX) 250 MG tablet Take 4 tablets (1 gram) x 1 dose. Patient not taking: Reported on 11/26/2017 11/24/17   Hetty Blend L, NP-C  HYDROcodone-acetaminophen (NORCO/VICODIN) 5-325 MG tablet Take 1 tablet by mouth every 4 (four) hours as needed. 11/26/17   Jacalyn Lefevre, MD  losartan (COZAAR) 100 MG tablet Take 100 mg by mouth daily.  09/13/17   [provider]  ondansetron (ZOFRAN ODT) 4 MG  disintegrating tablet Take 1 tablet (4 mg total) by mouth every 8 (eight) hours as needed. 11/26/17   Jacalyn Lefevre, MD  Probiotic Product (PROBIOTIC & ACIDOPHILUS EX ST) CAPS Take 1 capsule by mouth daily. 11/26/17   Jacalyn Lefevre, MD    Family History Family History  Problem Relation Age of Onset  . Hypertension Mother   . Diabetes Mother   . Cancer Mother     Social History Social History   Tobacco Use  . Smoking status: Current Every Day Smoker    Packs/day: 1.00    Years: 33.00    Pack years: 33.00  . Smokeless tobacco: Never Used  Substance Use Topics  . Alcohol use: Never    Frequency: Never  . Drug use: Never     Allergies   Patient has no known allergies.   Review of Systems Review of Systems  Constitutional: Negative for chills and fever.  Gastrointestinal: Negative for abdominal pain, nausea and vomiting.  Genitourinary: Negative for dysuria, flank pain, frequency, genital sores, pelvic pain, vaginal bleeding, vaginal discharge and vaginal pain.  Skin: Negative for color change and rash.  All other systems reviewed and are negative.    Physical Exam Updated Vital Signs BP (!) 142/108 (BP Location: Left Arm)   Pulse 84   Temp 98.4 F (36.9 C) (Oral)   Resp 16   Ht 5' 6.5" (  1.689 m)   Wt 54.4 kg   SpO2 100%   BMI 19.08 kg/m   Physical Exam  Constitutional: She appears well-developed and well-nourished. No distress.  HENT:  Head: Normocephalic and atraumatic.  Eyes: Right eye exhibits no discharge. Left eye exhibits no discharge.  Cardiovascular: Normal rate, regular rhythm, normal heart sounds and intact distal pulses.  Pulmonary/Chest: Effort normal and breath sounds normal. No respiratory distress.  Respirations equal and unlabored, patient able to speak in full sentences, lungs clear to auscultation bilaterally  Abdominal: Soft. Bowel sounds are normal. She exhibits no distension and no mass. There is no tenderness. There is no guarding.    Abdomen soft, nondistended, nontender to palpation in all quadrants without guarding or peritoneal signs  Neurological: She is alert. Coordination normal.  Skin: Skin is warm and dry. Capillary refill takes less than 2 seconds. She is not diaphoretic.  Psychiatric: She has a normal mood and affect. Her behavior is normal.  Nursing note and vitals reviewed.    ED Treatments / Results  Labs (all labs ordered are listed, but only abnormal results are displayed) Labs Reviewed  URINALYSIS, ROUTINE W REFLEX MICROSCOPIC - Abnormal; Notable for the following components:      Result Value   Hgb urine dipstick SMALL (*)    Protein, ur 100 (*)    Leukocytes, UA SMALL (*)    All other components within normal limits  HIV ANTIBODY (ROUTINE TESTING W REFLEX)  RPR  GC/CHLAMYDIA PROBE AMP ([place]) NOT [facility]    EKG None  Radiology No results found.  Procedures Procedures (including critical care time)  Medications Ordered in ED Medications  cefTRIAXone (ROCEPHIN) injection 250 mg (250 mg Intramuscular Given 01/06/18 1623)  azithromycin (ZITHROMAX) tablet 1,000 mg (1,000 mg Oral Given 01/06/18 1623)  lidocaine (XYLOCAINE) 1 % (with pres) injection (20 mLs  Given 01/06/18 1623)     Initial Impression / Assessment and Plan / ED Course  I have reviewed the triage vital signs and the nursing notes.  Pertinent labs & imaging results that were available during my care of the patient were reviewed by me and considered in my medical decision making (see chart for details).  Pt presents with concerns for possible STD, was notified by a partner that she had been exposed to gonorrhea.  Patient is completely asymptomatic.  Pt understands that they have GC/Chlamydia cultures pending and that they will need to inform all sexual partners if results return positive. Pt has been treated prophylactically with azithromycin and Rocephin.  Given that patient is asymptomatic, pelvic exam was deferred,  abdomen is benign.  Patient to be discharged with instructions to follow up with OBGYN/PCP. Discussed importance of using protection when sexually active.    Final Clinical Impressions(s) / ED Diagnoses   Final diagnoses:  STD exposure    ED Discharge Orders    None       Legrand RamsFord, Jaiyon Wander N, PA-C 01/06/18 1642    Azalia Bilisampos, Kevin, MD 01/07/18 1545

## 2018-01-06 NOTE — ED Triage Notes (Signed)
Patient states her sexual partner called her today and stated he had an "STD and gonorrhea." patient denies any vaginal discharge and no urinary symptoms.

## 2018-01-06 NOTE — Discharge Instructions (Addendum)
You were treated prophylactically for gonorrhea and chlamydia today given your recent exposure, you have STD testing pending will be contacted in 2 to 3 days with any positive results, please notify any partners so they can be tested as well.  You can follow-up with your gynecologist or the health department for any further STD testing needs.

## 2018-01-07 LAB — GC/CHLAMYDIA PROBE AMP (~~LOC~~) NOT AT ARMC
Chlamydia: NEGATIVE
Neisseria Gonorrhea: NEGATIVE

## 2018-01-07 LAB — RPR: RPR Ser Ql: NONREACTIVE

## 2018-01-07 LAB — HIV ANTIBODY (ROUTINE TESTING W REFLEX): HIV SCREEN 4TH GENERATION: NONREACTIVE

## 2018-01-09 ENCOUNTER — Telehealth: Payer: Self-pay | Admitting: Family Medicine

## 2018-01-09 MED ORDER — LOSARTAN POTASSIUM 100 MG PO TABS: 100.0000 mg | ORAL_TABLET | Freq: Every day | ORAL | 5 refills | Status: AC

## 2018-01-09 NOTE — Telephone Encounter (Signed)
done

## 2018-01-09 NOTE — Telephone Encounter (Signed)
Pt called requesting a refill for her Losartan pt uses [facility] [address]- [place], [place] - [address]

## 2018-03-12 ENCOUNTER — Encounter (HOSPITAL_COMMUNITY): Payer: Self-pay

## 2018-03-12 ENCOUNTER — Emergency Department (HOSPITAL_COMMUNITY)
Admission: EM | Admit: 2018-03-12 | Discharge: 2018-03-12 | Disposition: A | Discharge status: Home / Self Care | Payer: Self-pay | Attending: Emergency Medicine | Admitting: Emergency Medicine

## 2018-03-12 ENCOUNTER — Other Ambulatory Visit: Payer: Self-pay

## 2018-03-12 DIAGNOSIS — Z113 Encounter for screening for infections with a predominantly sexual mode of transmission: Secondary | ICD-10-CM | POA: Insufficient documentation

## 2018-03-12 DIAGNOSIS — Z79899 Other long term (current) drug therapy: Secondary | ICD-10-CM | POA: Insufficient documentation

## 2018-03-12 DIAGNOSIS — F17210 Nicotine dependence, cigarettes, uncomplicated: Secondary | ICD-10-CM | POA: Insufficient documentation

## 2018-03-12 DIAGNOSIS — N189 Chronic kidney disease, unspecified: Secondary | ICD-10-CM | POA: Insufficient documentation

## 2018-03-12 DIAGNOSIS — I129 Hypertensive chronic kidney disease with stage 1 through stage 4 chronic kidney disease, or unspecified chronic kidney disease: Secondary | ICD-10-CM | POA: Insufficient documentation

## 2018-03-12 DIAGNOSIS — Z202 Contact with and (suspected) exposure to infections with a predominantly sexual mode of transmission: Secondary | ICD-10-CM

## 2018-03-12 DIAGNOSIS — B9689 Other specified bacterial agents as the cause of diseases classified elsewhere: Secondary | ICD-10-CM

## 2018-03-12 DIAGNOSIS — N760 Acute vaginitis: Secondary | ICD-10-CM | POA: Insufficient documentation

## 2018-03-12 HISTORY — DX: Unspecified sexually transmitted disease: A64

## 2018-03-12 LAB — COMPREHENSIVE METABOLIC PANEL
ALT: 13 U/L (ref 0–44)
AST: 14 U/L — AB (ref 15–41)
Albumin: 4.1 g/dL (ref 3.5–5.0)
Alkaline Phosphatase: 81 U/L (ref 38–126)
Anion gap: 10 (ref 5–15)
BILIRUBIN TOTAL: 0.8 mg/dL (ref 0.3–1.2)
BUN: 33 mg/dL — ABNORMAL HIGH (ref 6–20)
CO2: 17 mmol/L — ABNORMAL LOW (ref 22–32)
Calcium: 9.3 mg/dL (ref 8.9–10.3)
Chloride: 112 mmol/L — ABNORMAL HIGH (ref 98–111)
Creatinine, Ser: 1.94 mg/dL — ABNORMAL HIGH (ref 0.44–1.00)
GFR calc Af Amer: 34 mL/min — ABNORMAL LOW (ref >60)
GFR calc non Af Amer: 29 mL/min — ABNORMAL LOW (ref >60)
Glucose, Bld: 111 mg/dL — ABNORMAL HIGH (ref 70–99)
POTASSIUM: 3.8 mmol/L (ref 3.5–5.1)
Sodium: 139 mmol/L (ref 135–145)
TOTAL PROTEIN: 7.7 g/dL (ref 6.5–8.1)

## 2018-03-12 LAB — WET PREP, GENITAL
Sperm: NONE SEEN
Trich, Wet Prep: NONE SEEN
Yeast Wet Prep HPF POC: NONE SEEN

## 2018-03-12 LAB — URINALYSIS, ROUTINE W REFLEX MICROSCOPIC
BACTERIA UA: NONE SEEN
BILIRUBIN URINE: NEGATIVE
Glucose, UA: NEGATIVE mg/dL
KETONES UR: NEGATIVE mg/dL
NITRITE: NEGATIVE
PROTEIN: 100 mg/dL — AB
Specific Gravity, Urine: 1.015 (ref 1.005–1.030)
pH: 5 (ref 5.0–8.0)

## 2018-03-12 LAB — CBC
HEMATOCRIT: 41.9 % (ref 36.0–46.0)
HEMOGLOBIN: 13.5 g/dL (ref 12.0–15.0)
MCH: 30 pg (ref 26.0–34.0)
MCHC: 32.2 g/dL (ref 30.0–36.0)
MCV: 93.1 fL (ref 80.0–100.0)
Platelets: 260 10*3/uL (ref 150–400)
RBC: 4.5 MIL/uL (ref 3.87–5.11)
RDW: 13.4 % (ref 11.5–15.5)
WBC: 5.9 10*3/uL (ref 4.0–10.5)
nRBC: 0 % (ref 0.0–0.2)

## 2018-03-12 LAB — ABO/RH: ABO/RH(D): O POS

## 2018-03-12 LAB — TYPE AND SCREEN
ABO/RH(D): O POS
Antibody Screen: NEGATIVE

## 2018-03-12 MED ORDER — CEFTRIAXONE SODIUM 250 MG IJ SOLR
250.0000 mg | Freq: Once | INTRAMUSCULAR | Status: AC
  Administered 2018-03-12: 250 mg via INTRAMUSCULAR
  Filled 2018-03-12: qty 250

## 2018-03-12 MED ORDER — METRONIDAZOLE 500 MG PO TABS: 500.0000 mg | ORAL_TABLET | Freq: Two times a day (BID) | ORAL | 0 refills | Status: DC

## 2018-03-12 MED ORDER — AZITHROMYCIN 250 MG PO TABS
1000.0000 mg | ORAL_TABLET | Freq: Once | ORAL | Status: AC
  Administered 2018-03-12: 1000 mg via ORAL
  Filled 2018-03-12: qty 4

## 2018-03-12 MED ORDER — STERILE WATER FOR INJECTION IJ SOLN
INTRAMUSCULAR | Status: AC
  Administered 2018-03-12: 10 mL
  Filled 2018-03-12: qty 10

## 2018-03-12 NOTE — ED Triage Notes (Addendum)
Patient c/o mid abdominal pain x 4 days and today she had a small amount of bright red blood on the toilet paper after having a BM.  Patient also c/o dysuria x 4 days.  Patient added that she was with a sexual partner whose condom fell off andwants to be checked for STD's

## 2018-03-12 NOTE — ED Provider Notes (Signed)
[facility]-EMERGENCY DEPT Provider Note   CSN: [id number] Arrival date & time: 03/12/18  1222     History   Chief Complaint Chief Complaint  Patient presents with  . Abdominal Pain  . Rectal Bleeding  . Dysuria    HPI Karen Mccall is a 53 y.o. female.  HPI  Pt was seen at 1705. Per pt, c/o gradual onset and persistence of constant vaginal discharge and dysuria for the past 4 days. Has been associated with intermittent vaginal bleeding; described as seeing blood on toilet paper when wiping, especially after having a BM today. Pt describes her symptoms as "like the last time I had a STD." Pt states she was recently with a partner whose condom fell off and is concerned about the possibility of STD exposure.  Denies pelvic pain, no hematuria, no flank pain, no N/V/D, no fevers, no rash, no black or blood in stools.    Past Medical History:  Diagnosis Date  . CKD (chronic kidney disease) 11/19/2017  . Hypertension   . STD (female)     Patient Active Problem List   Diagnosis Date Noted  . Proteinuria 11/19/2017  . CKD (chronic kidney disease) 11/19/2017  . Essential hypertension 10/15/2017  . History of thyroid disease 10/15/2017    Past Surgical History:  Procedure Laterality Date  . ABDOMINAL HYSTERECTOMY    . CARPAL TUNNEL RELEASE     in [place]  . CHOLECYSTECTOMY       OB History   No obstetric history on file.      Home Medications    Prior to Admission medications   Medication Sig Start Date End Date Taking? Authorizing Provider  amLODipine (NORVASC) 10 MG tablet Take 10 mg by mouth daily.  09/30/17   [provider]  azithromycin (ZITHROMAX) 250 MG tablet Take 4 tablets (1 gram) x 1 dose. Patient not taking: Reported on 11/26/2017 11/24/17   Hetty Blend L, NP-C  HYDROcodone-acetaminophen (NORCO/VICODIN) 5-325 MG tablet Take 1 tablet by mouth every 4 (four) hours as needed. 11/26/17   Jacalyn Lefevre, MD  losartan (COZAAR)  100 MG tablet Take 1 tablet (100 mg total) by mouth daily. 01/09/18   Henson, Vickie L, NP-C  ondansetron (ZOFRAN ODT) 4 MG disintegrating tablet Take 1 tablet (4 mg total) by mouth every 8 (eight) hours as needed. 11/26/17   Jacalyn Lefevre, MD  Probiotic Product (PROBIOTIC & ACIDOPHILUS EX ST) CAPS Take 1 capsule by mouth daily. 11/26/17   Jacalyn Lefevre, MD    Family History Family History  Problem Relation Age of Onset  . Hypertension Mother   . Diabetes Mother   . Cancer Mother     Social History Social History   Tobacco Use  . Smoking status: Current Every Day Smoker    Packs/day: 0.50    Years: 33.00    Pack years: 16.50  . Smokeless tobacco: Never Used  Substance Use Topics  . Alcohol use: Never    Frequency: Never  . Drug use: Never     Allergies   Patient has no known allergies.   Review of Systems Review of Systems ROS: Statement: All systems negative except as marked or noted in the HPI; Constitutional: Negative for fever and chills. ; ; Eyes: Negative for eye pain, redness and discharge. ; ; ENMT: Negative for ear pain, hoarseness, nasal congestion, sinus pressure and sore throat. ; ; Cardiovascular: Negative for chest pain, palpitations, diaphoresis, dyspnea and peripheral edema. ; ; Respiratory: Negative for cough,  wheezing and stridor. ; ; Gastrointestinal: Negative for nausea, vomiting, diarrhea, abdominal pain, blood in stool, hematemesis, jaundice and rectal bleeding. . ; ; Genitourinary: +dysuria. Negative for flank pain and hematuria. ; ; GYN:  No pelvic pain, +vaginal spotting, +vaginal discharge, no vulvar pain. ;; Musculoskeletal: Negative for back pain and neck pain. Negative for swelling and trauma.; ; Skin: Negative for pruritus, rash, abrasions, blisters, bruising and skin lesion.; ; Neuro: Negative for headache, lightheadedness and neck stiffness. Negative for weakness, altered level of consciousness, altered mental status, extremity weakness,  paresthesias, involuntary movement, seizure and syncope.       Physical Exam Updated Vital Signs BP 113/79 (BP Location: Right Arm)   Pulse 84   Temp 98.3 F (36.8 C) (Oral)   Resp 12   Ht 5' 6.5" (1.689 m)   Wt 56.7 kg   SpO2 100%   BMI 19.87 kg/m   Physical Exam 1710: Physical examination:  Nursing notes reviewed; Vital signs and O2 SAT reviewed;  Constitutional: Well developed, Well nourished, Well hydrated, In no acute distress; Head:  Normocephalic, atraumatic; Eyes: EOMI, PERRL, No scleral icterus; ENMT: Mouth and pharynx normal, Mucous membranes moist; Neck: Supple, Full range of motion, No lymphadenopathy; Cardiovascular: Regular rate and rhythm, No gallop; Respiratory: Breath sounds clear & equal bilaterally, No wheezes.  Speaking full sentences with ease, Normal respiratory effort/excursion; Chest: Nontender, Movement normal; Abdomen: Soft, Nontender, Nondistended, Normal bowel sounds. Rectal exam performed w/permission of pt and ED Tech chaperone present.  Anal tone normal. No fissures, no external hemorrhoids..; Genitourinary: No CVA tenderness.  Pelvic exam performed with permission of pt and female ED tech assist during exam.  External genitalia w/o lesions. Vaginal vault with thick white discharge and few areas of friable tissue with localized dried blood. No lacs, no active bleeding. Cervix w/o lesions, not friable, GC/chlam and wet prep obtained and sent to lab.  Bimanual exam w/o CMT, uterine or adnexal tenderness, +mild suprapubic tenderness..;; ; Extremities: Peripheral pulses normal, No tenderness, No edema, No calf edema or asymmetry.; Neuro: AA&Ox3, Major CN grossly intact.  Speech clear. No gross focal motor or sensory deficits in extremities. Climbs on and off stretcher easily by herself. Gait steady..; Skin: Color normal, Warm, Dry.   ED Treatments / Results  Labs (all labs ordered are listed, but only abnormal results are  displayed)   EKG None  Radiology   Procedures Procedures (including critical care time)  Medications Ordered in ED Medications - No data to display   Initial Impression / Assessment and Plan / ED Course  I have reviewed the triage vital signs and the nursing notes.  Pertinent labs & imaging results that were available during my care of the patient were reviewed by me and considered in my medical decision making (see chart for details).  MDM Reviewed: previous chart, nursing note and vitals Reviewed previous: labs Interpretation: labs   Results for orders placed or performed during the hospital encounter of 03/12/18  Wet prep, genital  Result Value Ref Range   Yeast Wet Prep HPF POC NONE SEEN NONE SEEN   Trich, Wet Prep NONE SEEN NONE SEEN   Clue Cells Wet Prep HPF POC PRESENT (A) NONE SEEN   WBC, Wet Prep HPF POC FEW (A) NONE SEEN   Sperm NONE SEEN   Comprehensive metabolic panel  Result Value Ref Range   Sodium 139 135 - 145 mmol/L   Potassium 3.8 3.5 - 5.1 mmol/L   Chloride 112 (H) 98 - 111  mmol/L   CO2 17 (L) 22 - 32 mmol/L   Glucose, Bld 111 (H) 70 - 99 mg/dL   BUN 33 (H) 6 - 20 mg/dL   Creatinine, Ser 2.92 (H) 0.44 - 1.00 mg/dL   Calcium 9.3 8.9 - 44.6 mg/dL   Total Protein 7.7 6.5 - 8.1 g/dL   Albumin 4.1 3.5 - 5.0 g/dL   AST 14 (L) 15 - 41 U/L   ALT 13 0 - 44 U/L   Alkaline Phosphatase 81 38 - 126 U/L   Total Bilirubin 0.8 0.3 - 1.2 mg/dL   GFR calc non Af Amer 29 (L) >60 mL/min   GFR calc Af Amer 34 (L) >60 mL/min   Anion gap 10 5 - 15  CBC  Result Value Ref Range   WBC 5.9 4.0 - 10.5 K/uL   RBC 4.50 3.87 - 5.11 MIL/uL   Hemoglobin 13.5 12.0 - 15.0 g/dL   HCT 28.6 38.1 - 77.1 %   MCV 93.1 80.0 - 100.0 fL   MCH 30.0 26.0 - 34.0 pg   MCHC 32.2 30.0 - 36.0 g/dL   RDW 16.5 79.0 - 38.3 %   Platelets 260 150 - 400 K/uL   nRBC 0.0 0.0 - 0.2 %  Urinalysis, Routine w reflex microscopic- may I&O cath if menses  Result Value Ref Range   Color, Urine  YELLOW YELLOW   APPearance HAZY (A) CLEAR   Specific Gravity, Urine 1.015 1.005 - 1.030   pH 5.0 5.0 - 8.0   Glucose, UA NEGATIVE NEGATIVE mg/dL   Hgb urine dipstick SMALL (A) NEGATIVE   Bilirubin Urine NEGATIVE NEGATIVE   Ketones, ur NEGATIVE NEGATIVE mg/dL   Protein, ur 338 (A) NEGATIVE mg/dL   Nitrite NEGATIVE NEGATIVE   Leukocytes, UA TRACE (A) NEGATIVE   RBC / HPF 0-5 0 - 5 RBC/hpf   WBC, UA 0-5 0 - 5 WBC/hpf   Bacteria, UA NONE SEEN NONE SEEN   Squamous Epithelial / LPF 0-5 0 - 5   Mucus PRESENT   Type and screen [facility]  Result Value Ref Range   ABO/RH(D) O POS    Antibody Screen NEG    Sample Expiration      03/15/2018 Performed at [facility], [address], [place], [place] [address]   ABO/Rh  Result Value Ref Range   ABO/RH(D)      O POS Performed at [facility], [address] [place], [place] [address]     1850:  Cr mildly elevated from baseline; pt can continue PO fluids. Pt has tol PO well while in the ED without N/V. Bleeding on toilet paper likely from friable vaginal tissue. Will tx for possible STD exposure while in the ED, rx flagyl for BV. Dx and testing d/w pt.  Questions answered.  Verb understanding, agreeable to d/c home with outpt f/u.     Final Clinical Impressions(s) / ED Diagnoses   Final diagnoses:  None    ED Discharge Orders    None       Samuel Jester, DO 03/16/18 1337

## 2018-03-12 NOTE — Discharge Instructions (Signed)
Take the prescription as directed.  Your gonorrhea and chlamydia culture is pending results, and you will receive a phone call in the next several days if it is positive.  However, you were treated empirically today with antibiotics for both gonorrhea and chlamydia.  Your renal function tests were mildly elevated today; you will need your regular medical doctor to recheck these levels in the next several days. Increase your fluids for the next few days. Call your regular OB/GYN doctor or your regular medical doctor tomorrow to schedule a follow up appointment within the next 4 days.  Return to the Emergency Department immediately if worsening.

## 2018-03-13 LAB — GC/CHLAMYDIA PROBE AMP (~~LOC~~) NOT AT ARMC
Chlamydia: NEGATIVE
NEISSERIA GONORRHEA: NEGATIVE

## 2018-07-21 DIAGNOSIS — Z20828 Contact with and (suspected) exposure to other viral communicable diseases: Secondary | ICD-10-CM | POA: Diagnosis not present

## 2018-07-21 DIAGNOSIS — J029 Acute pharyngitis, unspecified: Secondary | ICD-10-CM | POA: Diagnosis not present

## 2018-07-21 DIAGNOSIS — G4452 New daily persistent headache (NDPH): Secondary | ICD-10-CM | POA: Diagnosis not present

## 2018-07-22 DIAGNOSIS — M791 Myalgia, unspecified site: Secondary | ICD-10-CM | POA: Insufficient documentation

## 2018-07-27 DIAGNOSIS — R110 Nausea: Secondary | ICD-10-CM | POA: Insufficient documentation

## 2018-07-28 DIAGNOSIS — R5383 Other fatigue: Secondary | ICD-10-CM | POA: Insufficient documentation

## 2018-07-29 DIAGNOSIS — Z20828 Contact with and (suspected) exposure to other viral communicable diseases: Secondary | ICD-10-CM | POA: Insufficient documentation

## 2018-08-16 ENCOUNTER — Other Ambulatory Visit: Payer: Self-pay

## 2018-08-16 ENCOUNTER — Encounter (HOSPITAL_COMMUNITY): Payer: Self-pay | Admitting: Emergency Medicine

## 2018-08-16 ENCOUNTER — Emergency Department (HOSPITAL_COMMUNITY)
Admission: EM | Admit: 2018-08-16 | Discharge: 2018-08-16 | Disposition: A | Discharge status: Home / Self Care | Payer: BC Managed Care – PPO | Attending: Emergency Medicine | Admitting: Emergency Medicine

## 2018-08-16 DIAGNOSIS — I129 Hypertensive chronic kidney disease with stage 1 through stage 4 chronic kidney disease, or unspecified chronic kidney disease: Secondary | ICD-10-CM | POA: Diagnosis not present

## 2018-08-16 DIAGNOSIS — F17210 Nicotine dependence, cigarettes, uncomplicated: Secondary | ICD-10-CM | POA: Diagnosis not present

## 2018-08-16 DIAGNOSIS — E079 Disorder of thyroid, unspecified: Secondary | ICD-10-CM | POA: Diagnosis not present

## 2018-08-16 DIAGNOSIS — Z79899 Other long term (current) drug therapy: Secondary | ICD-10-CM | POA: Insufficient documentation

## 2018-08-16 DIAGNOSIS — R51 Headache: Secondary | ICD-10-CM | POA: Diagnosis not present

## 2018-08-16 DIAGNOSIS — N189 Chronic kidney disease, unspecified: Secondary | ICD-10-CM | POA: Insufficient documentation

## 2018-08-16 DIAGNOSIS — R519 Headache, unspecified: Secondary | ICD-10-CM

## 2018-08-16 DIAGNOSIS — G43909 Migraine, unspecified, not intractable, without status migrainosus: Secondary | ICD-10-CM | POA: Diagnosis not present

## 2018-08-16 MED ORDER — SUMATRIPTAN SUCCINATE 6 MG/0.5ML ~~LOC~~ SOLN
6.0000 mg | Freq: Once | SUBCUTANEOUS | Status: AC
  Administered 2018-08-16: 6 mg via SUBCUTANEOUS
  Filled 2018-08-16: qty 0.5

## 2018-08-16 MED ORDER — TRAMADOL HCL 50 MG PO TABS
50.0000 mg | ORAL_TABLET | Freq: Once | ORAL | Status: AC
  Administered 2018-08-16: 50 mg via ORAL
  Filled 2018-08-16: qty 1

## 2018-08-16 MED ORDER — METOCLOPRAMIDE HCL 10 MG PO TABS
10.0000 mg | ORAL_TABLET | Freq: Once | ORAL | Status: AC
  Administered 2018-08-16: 10 mg via ORAL
  Filled 2018-08-16: qty 1

## 2018-08-16 NOTE — ED Triage Notes (Signed)
Patient is complaining of headache that she has had since Friday. Patient states that she has taken several medications. She states none of them have relieved her pain.

## 2018-08-16 NOTE — ED Notes (Signed)
Patient given discharge teaching and verbalized understanding. Patient ambulated out of ED with a steady gait. 

## 2018-08-16 NOTE — Discharge Instructions (Addendum)
It was our pleasure to provide your ER care today - we hope that you feel better.  Rest. Drink plenty of fluids.  Take excedrin as need for headache.   Follow up with primary care doctor in the coming week.  Return to ER if worse, new symptoms, fevers, worsening or severe pain, persistent vomiting, other concern.   You were given pain medication in the ER - no driving for the next 4 hours.

## 2018-08-16 NOTE — ED Notes (Signed)
Patient was given a cup of ice water to drink.

## 2018-08-16 NOTE — ED Provider Notes (Signed)
[facility]-EMERGENCY DEPT Provider Note   CSN: [id number] Arrival date & time: 08/16/18  [id number]     History   Chief Complaint Chief Complaint  Patient presents with  . Migraine    HPI Karen Mccall is a 53 y.o. female.     Patient c/o 'migraine' headache on and off for past few days. Notes prior hx frequent headaches, headaches had improved, but now having occasional headaches. Headache gradual onset, recurrent, moderate, dull to throbbing in nature, frontal, non radiating, c/w prior headaches.  Denies sinus drainage/pain. No eye pain or change in vision. No neck pain or stiffness. No fevers. Denies any recent head trauma or fall. No syncope. Denies any change in speech. No numbness or weakness. No problems w balance, coordination, or normal functional ability. With headaches, worse w loud noise, or bright light. No positional change. No change w time of day. Has tried acetaminophen and ibuprofen without relief.   The history is provided by the patient.  Migraine Pertinent negatives include no chest pain, no abdominal pain, no headaches and no shortness of breath.    Past Medical History:  Diagnosis Date  . CKD (chronic kidney disease) 11/19/2017  . Hypertension   . STD (female)     Patient Active Problem List   Diagnosis Date Noted  . Proteinuria 11/19/2017  . CKD (chronic kidney disease) 11/19/2017  . Essential hypertension 10/15/2017  . History of thyroid disease 10/15/2017    Past Surgical History:  Procedure Laterality Date  . ABDOMINAL HYSTERECTOMY    . CARPAL TUNNEL RELEASE     in [place]  . CHOLECYSTECTOMY       OB History   No obstetric history on file.      Home Medications    Prior to Admission medications   Medication Sig Start Date End Date Taking? Authorizing Provider  amLODipine (NORVASC) 10 MG tablet Take 10 mg by mouth daily.  09/30/17   [provider]  azithromycin (ZITHROMAX) 250 MG tablet Take 4 tablets (1  gram) x 1 dose. Patient not taking: Reported on 11/26/2017 11/24/17   Hetty BlendHenson, Vickie L, NP-C  HYDROcodone-acetaminophen (NORCO/VICODIN) 5-325 MG tablet Take 1 tablet by mouth every 4 (four) hours as needed. Patient not taking: Reported on 03/12/2018 11/26/17   Jacalyn LefevreHaviland, Julie, MD  losartan (COZAAR) 100 MG tablet Take 1 tablet (100 mg total) by mouth daily. 01/09/18   Henson, Vickie L, NP-C  metroNIDAZOLE (FLAGYL) 500 MG tablet Take 1 tablet (500 mg total) by mouth 2 (two) times daily. 03/12/18   Samuel JesterMcManus, Kathleen, DO  ondansetron (ZOFRAN ODT) 4 MG disintegrating tablet Take 1 tablet (4 mg total) by mouth every 8 (eight) hours as needed. Patient not taking: Reported on 03/12/2018 11/26/17   Jacalyn LefevreHaviland, Julie, MD  Probiotic Product (PROBIOTIC & ACIDOPHILUS EX ST) CAPS Take 1 capsule by mouth daily. Patient not taking: Reported on 03/12/2018 11/26/17   Jacalyn LefevreHaviland, Julie, MD    Family History Family History  Problem Relation Age of Onset  . Hypertension Mother   . Diabetes Mother   . Cancer Mother     Social History Social History   Tobacco Use  . Smoking status: Current Every Day Smoker    Packs/day: 0.50    Years: 33.00    Pack years: 16.50  . Smokeless tobacco: Never Used  Substance Use Topics  . Alcohol use: Never    Frequency: Never  . Drug use: Never     Allergies   Other   Review  of Systems Review of Systems  Constitutional: Negative for chills and fever.  HENT: Negative for rhinorrhea and sinus pain.   Eyes: Negative for pain, redness and visual disturbance.  Respiratory: Negative for cough and shortness of breath.   Cardiovascular: Negative for chest pain.  Gastrointestinal: Negative for abdominal pain, nausea and vomiting.  Genitourinary: Negative for flank pain.  Musculoskeletal: Negative for back pain, neck pain and neck stiffness.  Skin: Negative for rash.  Neurological: Negative for syncope, speech difficulty, weakness, numbness and headaches.  Hematological: Does not  bruise/bleed easily.  Psychiatric/Behavioral: Negative for confusion.     Physical Exam Updated Vital Signs BP 120/84 (BP Location: Left Arm)   Pulse (!) 105   Temp 98.8 F (37.1 C) (Oral)   Resp 18   Ht 1.676 m (5\' 6" )   Wt 54.4 kg   SpO2 100%   BMI 19.37 kg/m   Physical Exam Vitals signs and nursing note reviewed.  Constitutional:      General: She is not in acute distress.    Appearance: Normal appearance. She is well-developed. She is not diaphoretic.  HENT:     Head: Atraumatic.     Comments: No sinus or temporal tenderness.     Nose: Nose normal.     Mouth/Throat:     Mouth: Mucous membranes are moist.  Eyes:     General: No scleral icterus.    Conjunctiva/sclera: Conjunctivae normal.     Pupils: Pupils are equal, round, and reactive to light.  Neck:     Musculoskeletal: Normal range of motion and neck supple. No neck rigidity or muscular tenderness.     Thyroid: No thyromegaly.     Trachea: No tracheal deviation.     Comments: No stiffness or rigidity.  Cardiovascular:     Rate and Rhythm: Normal rate and regular rhythm.     Pulses: Normal pulses.     Heart sounds: Normal heart sounds. No murmur. No friction rub. No gallop.   Pulmonary:     Effort: Pulmonary effort is normal. No respiratory distress.     Breath sounds: Normal breath sounds.  Abdominal:     General: Bowel sounds are normal. There is no distension.     Palpations: Abdomen is soft.     Tenderness: There is no abdominal tenderness. There is no guarding.  Genitourinary:    Comments: No cva tenderness.  Musculoskeletal: Normal range of motion.        General: No swelling or tenderness.  Skin:    General: Skin is warm and dry.     Findings: No rash.  Neurological:     Mental Status: She is alert and oriented to person, place, and time.     Cranial Nerves: No cranial nerve deficit.     Comments: Alert, speech normal/fluent.no facial asymmetry or droop.  Motor intact bil, stre 5/5. No pronator  drift. sens grossly intact bil. Steady gait.   Psychiatric:        Mood and Affect: Mood normal.      ED Treatments / Results  Labs (all labs ordered are listed, but only abnormal results are displayed) Labs Reviewed - No data to display  EKG    Radiology No results found.  Procedures Procedures (including critical care time)  Medications Ordered in ED Medications  SUMAtriptan (IMITREX) injection 6 mg (6 mg Subcutaneous Given 08/16/18 0726)  metoCLOPramide (REGLAN) tablet 10 mg (10 mg Oral Given 08/16/18 0725)     Initial Impression / Assessment and  Plan / ED Course  I have reviewed the triage vital signs and the nursing notes.  Pertinent labs & imaging results that were available during my care of the patient were reviewed by me and considered in my medical decision making (see chart for details).  Reviewed nursing notes and prior charts for additional history.   imitrex sq. reglan po.  Po fluids.  Recheck, pt comfortable, headache improved.   Rec pcp f/u as outpt.   Return precautions provided.     Final Clinical Impressions(s) / ED Diagnoses   Final diagnoses:  None    ED Discharge Orders    None       Cathren LaineSteinl, [place], MD 08/21/18 1034

## 2018-08-17 ENCOUNTER — Emergency Department (HOSPITAL_COMMUNITY)
Admission: EM | Admit: 2018-08-17 | Discharge: 2018-08-17 | Disposition: A | Discharge status: Home / Self Care | Payer: BC Managed Care – PPO | Attending: Emergency Medicine | Admitting: Emergency Medicine

## 2018-08-17 ENCOUNTER — Other Ambulatory Visit: Payer: Self-pay

## 2018-08-17 ENCOUNTER — Encounter (HOSPITAL_COMMUNITY): Payer: Self-pay | Admitting: Emergency Medicine

## 2018-08-17 ENCOUNTER — Emergency Department (HOSPITAL_COMMUNITY): Payer: BC Managed Care – PPO

## 2018-08-17 DIAGNOSIS — R079 Chest pain, unspecified: Secondary | ICD-10-CM | POA: Diagnosis not present

## 2018-08-17 DIAGNOSIS — I129 Hypertensive chronic kidney disease with stage 1 through stage 4 chronic kidney disease, or unspecified chronic kidney disease: Secondary | ICD-10-CM | POA: Insufficient documentation

## 2018-08-17 DIAGNOSIS — F17210 Nicotine dependence, cigarettes, uncomplicated: Secondary | ICD-10-CM | POA: Insufficient documentation

## 2018-08-17 DIAGNOSIS — Z20828 Contact with and (suspected) exposure to other viral communicable diseases: Secondary | ICD-10-CM | POA: Diagnosis not present

## 2018-08-17 DIAGNOSIS — R51 Headache: Secondary | ICD-10-CM | POA: Insufficient documentation

## 2018-08-17 DIAGNOSIS — N189 Chronic kidney disease, unspecified: Secondary | ICD-10-CM | POA: Insufficient documentation

## 2018-08-17 DIAGNOSIS — Z79899 Other long term (current) drug therapy: Secondary | ICD-10-CM | POA: Insufficient documentation

## 2018-08-17 DIAGNOSIS — R519 Headache, unspecified: Secondary | ICD-10-CM

## 2018-08-17 DIAGNOSIS — R42 Dizziness and giddiness: Secondary | ICD-10-CM | POA: Insufficient documentation

## 2018-08-17 DIAGNOSIS — E860 Dehydration: Secondary | ICD-10-CM | POA: Diagnosis not present

## 2018-08-17 DIAGNOSIS — B349 Viral infection, unspecified: Secondary | ICD-10-CM

## 2018-08-17 LAB — CBC
HCT: 39.1 % (ref 36.0–46.0)
Hemoglobin: 12.8 g/dL (ref 12.0–15.0)
MCH: 31 pg (ref 26.0–34.0)
MCHC: 32.7 g/dL (ref 30.0–36.0)
MCV: 94.7 fL (ref 80.0–100.0)
Platelets: 191 10*3/uL (ref 150–400)
RBC: 4.13 MIL/uL (ref 3.87–5.11)
RDW: 13.3 % (ref 11.5–15.5)
WBC: 4.1 10*3/uL (ref 4.0–10.5)
nRBC: 0 % (ref 0.0–0.2)

## 2018-08-17 LAB — BASIC METABOLIC PANEL
Anion gap: 10 (ref 5–15)
BUN: 23 mg/dL — ABNORMAL HIGH (ref 6–20)
CO2: 20 mmol/L — ABNORMAL LOW (ref 22–32)
Calcium: 9 mg/dL (ref 8.9–10.3)
Chloride: 106 mmol/L (ref 98–111)
Creatinine, Ser: 1.89 mg/dL — ABNORMAL HIGH (ref 0.44–1.00)
GFR calc Af Amer: 35 mL/min — ABNORMAL LOW (ref >60)
GFR calc non Af Amer: 30 mL/min — ABNORMAL LOW (ref >60)
Glucose, Bld: 73 mg/dL (ref 70–99)
Potassium: 4.4 mmol/L (ref 3.5–5.1)
Sodium: 136 mmol/L (ref 135–145)

## 2018-08-17 LAB — TROPONIN I (HIGH SENSITIVITY)
Troponin I (High Sensitivity): 5 ng/L (ref <18)
Troponin I (High Sensitivity): 4 ng/L (ref <18)

## 2018-08-17 MED ORDER — ONDANSETRON 8 MG PO TBDP: 8.0000 mg | ORAL_TABLET | Freq: Three times a day (TID) | ORAL | 0 refills | Status: DC | PRN

## 2018-08-17 MED ORDER — ONDANSETRON HCL 4 MG/2ML IJ SOLN
4.0000 mg | Freq: Once | INTRAMUSCULAR | Status: AC
  Administered 2018-08-17: 4 mg via INTRAVENOUS
  Filled 2018-08-17 (×2): qty 2

## 2018-08-17 MED ORDER — SODIUM CHLORIDE 0.9 % IV BOLUS
1500.0000 mL | Freq: Once | INTRAVENOUS | Status: AC
  Administered 2018-08-17: 1500 mL via INTRAVENOUS

## 2018-08-17 MED ORDER — ACETAMINOPHEN 500 MG PO TABS
1000.0000 mg | ORAL_TABLET | Freq: Once | ORAL | Status: AC
  Administered 2018-08-17: 1000 mg via ORAL
  Filled 2018-08-17: qty 2

## 2018-08-17 NOTE — ED Provider Notes (Signed)
[facility]-EMERGENCY DEPT Provider Note   CSN: [id number] Arrival date & time: 08/17/18  [id number]     History   Chief Complaint Chief Complaint  Patient presents with  . Headache  . Dizziness  . Chest Pain    HPI Karen Mccall is a 53 y.o. female.     HPI 53 year old female presents the emergency department for ongoing headache throughout the weekend.  She is had nausea without vomiting.  She reports some loose stools.  Mild lightheadedness.  Decreased oral intake.  She has had mild nonproductive cough.  Chills throughout the week and without documented fever.  No known sick contacts.  Presents with an oral temperature of 99.5.   Past Medical History:  Diagnosis Date  . CKD (chronic kidney disease) 11/19/2017  . Hypertension   . STD (female)     Patient Active Problem List   Diagnosis Date Noted  . Proteinuria 11/19/2017  . CKD (chronic kidney disease) 11/19/2017  . Essential hypertension 10/15/2017  . History of thyroid disease 10/15/2017    Past Surgical History:  Procedure Laterality Date  . ABDOMINAL HYSTERECTOMY    . CARPAL TUNNEL RELEASE     in [place]  . CHOLECYSTECTOMY       OB History   No obstetric history on file.      Home Medications    Prior to Admission medications   Medication Sig Start Date End Date Taking? Authorizing Provider  acetaminophen (TYLENOL) 500 MG tablet Take 1,000 mg by mouth every 6 (six) hours as needed for mild pain.   Yes [provider]  amLODipine (NORVASC) 10 MG tablet Take 10 mg by mouth daily.  09/30/17  Yes [provider]  Ibuprofen-diphenhydrAMINE HCl (ADVIL PM) 200-25 MG CAPS Take 2 tablets by mouth at bedtime as needed (sleep).   Yes [provider]  losartan (COZAAR) 100 MG tablet Take 1 tablet (100 mg total) by mouth daily. 01/09/18  Yes Henson, Vickie L, NP-C  azithromycin (ZITHROMAX) 250 MG tablet Take 4 tablets (1 gram) x 1 dose. Patient not taking: Reported on  11/26/2017 11/24/17   Hetty BlendHenson, Vickie L, NP-C  HYDROcodone-acetaminophen (NORCO/VICODIN) 5-325 MG tablet Take 1 tablet by mouth every 4 (four) hours as needed. Patient not taking: Reported on 03/12/2018 11/26/17   Jacalyn LefevreHaviland, Julie, MD  metroNIDAZOLE (FLAGYL) 500 MG tablet Take 1 tablet (500 mg total) by mouth 2 (two) times daily. Patient not taking: Reported on 08/16/2018 03/12/18   Samuel JesterMcManus, Kathleen, DO  ondansetron (ZOFRAN ODT) 4 MG disintegrating tablet Take 1 tablet (4 mg total) by mouth every 8 (eight) hours as needed. Patient not taking: Reported on 03/12/2018 11/26/17   Jacalyn LefevreHaviland, Julie, MD  Probiotic Product (PROBIOTIC & ACIDOPHILUS EX ST) CAPS Take 1 capsule by mouth daily. Patient not taking: Reported on 03/12/2018 11/26/17   Jacalyn LefevreHaviland, Julie, MD    Family History Family History  Problem Relation Age of Onset  . Hypertension Mother   . Diabetes Mother   . Cancer Mother     Social History Social History   Tobacco Use  . Smoking status: Current Every Day Smoker    Packs/day: 0.50    Years: 33.00    Pack years: 16.50  . Smokeless tobacco: Never Used  Substance Use Topics  . Alcohol use: Never    Frequency: Never  . Drug use: Never     Allergies   Patient has no known allergies.   Review of Systems Review of Systems  All other systems  reviewed and are negative.    Physical Exam Updated Vital Signs BP 121/88 (BP Location: Left Arm)   Pulse 98   Temp 99.1 F (37.3 C) (Oral)   Resp 17   Ht 5\' 6"  (1.676 m)   Wt 54.4 kg   SpO2 100%   BMI 19.37 kg/m   Physical Exam Vitals signs and nursing note reviewed.  Constitutional:      General: She is not in acute distress.    Appearance: She is well-developed.  HENT:     Head: Normocephalic and atraumatic.  Eyes:     Pupils: Pupils are equal, round, and reactive to light.  Neck:     Musculoskeletal: Normal range of motion.  Cardiovascular:     Rate and Rhythm: Normal rate and regular rhythm.     Heart sounds: Normal  heart sounds.  Pulmonary:     Effort: Pulmonary effort is normal.     Breath sounds: Normal breath sounds.  Abdominal:     General: There is no distension.     Palpations: Abdomen is soft.     Tenderness: There is no abdominal tenderness.  Musculoskeletal: Normal range of motion.  Skin:    General: Skin is warm and dry.  Neurological:     Mental Status: She is alert and oriented to person, place, and time.     Comments: 5/5 strength in major muscle groups of  bilateral upper and lower extremities. Speech normal. No facial asymetry.   Psychiatric:        Judgment: Judgment normal.      ED Treatments / Results  Labs (all labs ordered are listed, but only abnormal results are displayed) Labs Reviewed  NOVEL CORONAVIRUS, NAA (HOSPITAL ORDER, SEND-OUT TO REF LAB)  CBC  TROPONIN I (HIGH SENSITIVITY)  BASIC METABOLIC PANEL  TROPONIN I (HIGH SENSITIVITY)    EKG EKG Interpretation  Date/Time:  Monday August 17 2018 07:21:38 EDT Ventricular Rate:  95 PR Interval:    QRS Duration: 91 QT Interval:  332 QTC Calculation: 418 R Axis:   49 Text Interpretation:  Sinus rhythm Atrial premature complex Biatrial enlargement RSR' in V1 or V2, right VCD or RVH No old tracing to compare ** Poor data quality, interpretation may be adversely affected Confirmed by Jola Schmidt [phone]) on 08/17/2018 7:24:44 AM Also confirmed by Jola Schmidt [phone]), editor Philomena Doheny [phone])  on 08/17/2018 7:45:33 AM   Radiology Dg Chest Portable 1 View  Result Date: 08/17/2018 CLINICAL DATA:  Chest pain EXAM: PORTABLE CHEST 1 VIEW COMPARISON:  None. FINDINGS: Lungs are clear. Heart size and pulmonary vascularity are normal. No adenopathy. No pneumothorax. There is thoracolumbar levoscoliosis. IMPRESSION: No edema or consolidation. Electronically Signed   By: Lowella Grip III M.D.   On: 08/17/2018 08:24    Procedures Procedures (including critical care time)  Medications Ordered in ED Medications   acetaminophen (TYLENOL) tablet 1,000 mg (1,000 mg Oral Given 08/17/18 0854)  sodium chloride 0.9 % bolus 1,500 mL (1,500 mLs Intravenous New Bag/Given 08/17/18 1031)  ondansetron (ZOFRAN) injection 4 mg (4 mg Intravenous Given 08/17/18 1032)     Initial Impression / Assessment and Plan / ED Course  I have reviewed the triage vital signs and the nursing notes.  Pertinent labs & imaging results that were available during my care of the patient were reviewed by me and considered in my medical decision making (see chart for details).        Overall well-appearing.  Feels much better after  IV fluids.  Work-up in the emergency department out significant abnormality.  No focal deficit.  I suspect with chills and 99.5 oral temperature here in the emergency department she likely has a viral syndrome.  COVID-19 is definitely in the differential diagnosis.  She has been checked for COVID-19.  She will home quarantine until her COVID-19 result returns tomorrow.  She will follow-up on this.  Primary care follow-up.  Patient understands return to the ER for new or worsening symptoms.  Feels better after IV fluids.  Final Clinical Impressions(s) / ED Diagnoses   Final diagnoses:  None    ED Discharge Orders         Ordered    ondansetron (ZOFRAN ODT) 8 MG disintegrating tablet  Every 8 hours PRN     08/17/18 1118           Azalia Bilisampos, Homer Pfeifer, MD 08/17/18 1121

## 2018-08-17 NOTE — ED Triage Notes (Signed)
Pt c/o headache, dizziness, nausea, chest pains since Friday that has been constant. Was here yesterday and got pain medications but headache continued.

## 2018-08-17 NOTE — ED Notes (Signed)
22g IV accessed, able to obtain one tube of blood then infiltrated. Attempted a 2nd IV unsuccessfully

## 2018-08-18 LAB — NOVEL CORONAVIRUS, NAA (HOSP ORDER, SEND-OUT TO REF LAB; TAT 18-24 HRS): SARS-CoV-2, NAA: NOT DETECTED

## 2018-08-19 ENCOUNTER — Telehealth: Payer: Self-pay | Admitting: Family Medicine

## 2018-08-19 NOTE — Telephone Encounter (Signed)
Dismissal letter in guarantor snapshot  °

## 2018-09-01 DIAGNOSIS — J301 Allergic rhinitis due to pollen: Secondary | ICD-10-CM | POA: Insufficient documentation

## 2018-09-01 DIAGNOSIS — R519 Headache, unspecified: Secondary | ICD-10-CM | POA: Insufficient documentation

## 2018-10-07 DIAGNOSIS — K921 Melena: Secondary | ICD-10-CM | POA: Diagnosis not present

## 2018-10-07 DIAGNOSIS — I10 Essential (primary) hypertension: Secondary | ICD-10-CM | POA: Diagnosis not present

## 2018-10-07 DIAGNOSIS — R636 Underweight: Secondary | ICD-10-CM | POA: Diagnosis not present

## 2018-10-27 DIAGNOSIS — R809 Proteinuria, unspecified: Secondary | ICD-10-CM | POA: Diagnosis not present

## 2018-10-27 DIAGNOSIS — I129 Hypertensive chronic kidney disease with stage 1 through stage 4 chronic kidney disease, or unspecified chronic kidney disease: Secondary | ICD-10-CM | POA: Diagnosis not present

## 2018-10-27 DIAGNOSIS — N183 Chronic kidney disease, stage 3 (moderate): Secondary | ICD-10-CM | POA: Diagnosis not present

## 2018-11-06 ENCOUNTER — Other Ambulatory Visit: Payer: Self-pay | Admitting: Nephrology

## 2018-11-06 DIAGNOSIS — N183 Chronic kidney disease, stage 3 unspecified: Secondary | ICD-10-CM

## 2018-11-16 DIAGNOSIS — N898 Other specified noninflammatory disorders of vagina: Secondary | ICD-10-CM | POA: Diagnosis not present

## 2018-11-16 DIAGNOSIS — K625 Hemorrhage of anus and rectum: Secondary | ICD-10-CM | POA: Diagnosis not present

## 2018-11-17 ENCOUNTER — Ambulatory Visit
Admission: RE | Admit: 2018-11-17 | Discharge: 2018-11-17 | Disposition: A | Discharge status: Home / Self Care | Payer: BC Managed Care – PPO | Source: Ambulatory Visit | Attending: Nephrology | Admitting: Nephrology

## 2018-11-17 DIAGNOSIS — N183 Chronic kidney disease, stage 3 unspecified: Secondary | ICD-10-CM

## 2018-11-19 DIAGNOSIS — N183 Chronic kidney disease, stage 3 unspecified: Secondary | ICD-10-CM | POA: Diagnosis not present

## 2018-12-26 ENCOUNTER — Other Ambulatory Visit: Payer: Self-pay | Admitting: Family Medicine

## 2018-12-29 ENCOUNTER — Other Ambulatory Visit: Payer: Self-pay | Admitting: Family Medicine

## 2019-06-09 ENCOUNTER — Other Ambulatory Visit: Payer: Self-pay

## 2019-06-09 ENCOUNTER — Ambulatory Visit (HOSPITAL_COMMUNITY)
Admission: EM | Admit: 2019-06-09 | Discharge: 2019-06-09 | Disposition: A | Discharge status: Home / Self Care | Payer: HRSA Program | Attending: Emergency Medicine | Admitting: Emergency Medicine

## 2019-06-09 ENCOUNTER — Encounter (HOSPITAL_COMMUNITY): Payer: Self-pay

## 2019-06-09 DIAGNOSIS — Z79899 Other long term (current) drug therapy: Secondary | ICD-10-CM | POA: Insufficient documentation

## 2019-06-09 DIAGNOSIS — R110 Nausea: Secondary | ICD-10-CM | POA: Insufficient documentation

## 2019-06-09 DIAGNOSIS — N189 Chronic kidney disease, unspecified: Secondary | ICD-10-CM | POA: Insufficient documentation

## 2019-06-09 DIAGNOSIS — B349 Viral infection, unspecified: Secondary | ICD-10-CM | POA: Diagnosis not present

## 2019-06-09 DIAGNOSIS — Z7901 Long term (current) use of anticoagulants: Secondary | ICD-10-CM | POA: Insufficient documentation

## 2019-06-09 DIAGNOSIS — R197 Diarrhea, unspecified: Secondary | ICD-10-CM | POA: Diagnosis not present

## 2019-06-09 DIAGNOSIS — R519 Headache, unspecified: Secondary | ICD-10-CM | POA: Diagnosis not present

## 2019-06-09 DIAGNOSIS — F17210 Nicotine dependence, cigarettes, uncomplicated: Secondary | ICD-10-CM | POA: Diagnosis not present

## 2019-06-09 DIAGNOSIS — I129 Hypertensive chronic kidney disease with stage 1 through stage 4 chronic kidney disease, or unspecified chronic kidney disease: Secondary | ICD-10-CM | POA: Insufficient documentation

## 2019-06-09 DIAGNOSIS — R6883 Chills (without fever): Secondary | ICD-10-CM | POA: Insufficient documentation

## 2019-06-09 DIAGNOSIS — Z20822 Contact with and (suspected) exposure to covid-19: Secondary | ICD-10-CM | POA: Diagnosis not present

## 2019-06-09 MED ORDER — ONDANSETRON 4 MG PO TBDP: 4.0000 mg | ORAL_TABLET | Freq: Three times a day (TID) | ORAL | 0 refills | Status: DC | PRN

## 2019-06-09 NOTE — ED Triage Notes (Signed)
Pt presents to UC with headache, nausea, diarrhea, body aches and chills x 1 day. She had no tried anything for her symptoms.

## 2019-06-09 NOTE — ED Provider Notes (Signed)
[facility]    CSN: [id number] Arrival date & time: 06/09/19  1444      History   Chief Complaint Chief Complaint  Patient presents with  . Chills  . Nausea  . Diarrhea  . Generalized Body Aches  . Headache    HPI Karen Mccall is a 54 y.o. female history of CKD, hypertension, presenting today for evaluation of headache, nausea, diarrhea and body aches.  Symptoms began over the past 1 to 2 days.  She has had minimal URI symptoms, reports only occasional nasal congestion, but no persistent congestion sore throat or cough.  Denies any known exposures to Covid.  Does report some abdominal pain which has eased off some since initial onset.  Feels abdominal pain is related to frequent bowels.  Denies blood in stool.  HPI  Past Medical History:  Diagnosis Date  . CKD (chronic kidney disease) 11/19/2017  . Hypertension   . STD (female)     Patient Active Problem List   Diagnosis Date Noted  . Proteinuria 11/19/2017  . CKD (chronic kidney disease) 11/19/2017  . Essential hypertension 10/15/2017  . History of thyroid disease 10/15/2017    Past Surgical History:  Procedure Laterality Date  . ABDOMINAL HYSTERECTOMY    . CARPAL TUNNEL RELEASE     in [place]  . CHOLECYSTECTOMY      OB History   No obstetric history on file.      Home Medications    Prior to Admission medications   Medication Sig Start Date End Date Taking? Authorizing Provider  amLODipine (NORVASC) 10 MG tablet Take 10 mg by mouth daily.  09/30/17   [provider]  losartan (COZAAR) 100 MG tablet Take 1 tablet (100 mg total) by mouth daily. 01/09/18   Henson, Vickie L, NP-C  ondansetron (ZOFRAN ODT) 4 MG disintegrating tablet Take 1 tablet (4 mg total) by mouth every 8 (eight) hours as needed for nausea or vomiting. 06/09/19   Eldar Robitaille C, PA-C  Ibuprofen-diphenhydrAMINE HCl (ADVIL PM) 200-25 MG CAPS Take 2 tablets by mouth at bedtime as needed (sleep).  06/09/19  [provider]    Family History Family History  Problem Relation Age of Onset  . Hypertension Mother   . Diabetes Mother   . Cancer Mother     Social History Social History   Tobacco Use  . Smoking status: Current Every Day Smoker    Packs/day: 0.50    Years: 33.00    Pack years: 16.50  . Smokeless tobacco: Never Used  Substance Use Topics  . Alcohol use: Never  . Drug use: Never     Allergies   Patient has no known allergies.   Review of Systems Review of Systems  Constitutional: Positive for appetite change, chills and fatigue. Negative for activity change and fever.  HENT: Negative for congestion, ear pain, rhinorrhea, sinus pressure, sore throat and trouble swallowing.   Eyes: Negative for discharge and redness.  Respiratory: Negative for cough, chest tightness and shortness of breath.   Cardiovascular: Negative for chest pain.  Gastrointestinal: Positive for abdominal pain, diarrhea and nausea. Negative for vomiting.  Musculoskeletal: Positive for myalgias.  Skin: Negative for rash.  Neurological: Negative for dizziness, light-headedness and headaches.     Physical Exam Triage Vital Signs ED Triage Vitals  Enc Vitals Group     BP 06/09/19 1530 (!) 141/123     Pulse Rate 06/09/19 1530 87     Resp 06/09/19 1530 18  Temp 06/09/19 1530 98.8 F (37.1 C)     Temp Source 06/09/19 1530 Oral     SpO2 06/09/19 1530 100 %     Weight --      Height --      Head Circumference --      Peak Flow --      Pain Score 06/09/19 1529 10     Pain Loc --      Pain Edu? --      Excl. in [place]? --    No data found.  Updated Vital Signs BP (!) 141/123 (BP Location: Right Arm)   Pulse 87   Temp 98.8 F (37.1 C) (Oral)   Resp 18   SpO2 100%   Visual Acuity Right Eye Distance:   Left Eye Distance:   Bilateral Distance:    Right Eye Near:   Left Eye Near:    Bilateral Near:     Physical Exam Vitals and nursing note reviewed.  Constitutional:      General:  She is not in acute distress.    Appearance: She is well-developed.  HENT:     Head: Normocephalic and atraumatic.     Ears:     Comments: Bilateral ears without tenderness to palpation of external auricle, tragus and mastoid, EAC's without erythema or swelling, TM's with good bony landmarks and cone of light. Non erythematous.     Mouth/Throat:     Comments: Oral mucosa pink and moist, no tonsillar enlargement or exudate. Posterior pharynx patent and nonerythematous, no uvula deviation or swelling. Normal phonation. Eyes:     Conjunctiva/sclera: Conjunctivae normal.  Cardiovascular:     Rate and Rhythm: Normal rate and regular rhythm.     Heart sounds: No murmur.  Pulmonary:     Effort: Pulmonary effort is normal. No respiratory distress.     Breath sounds: Normal breath sounds.     Comments: Breathing comfortably at rest, CTABL, no wheezing, rales or other adventitious sounds auscultated Abdominal:     Palpations: Abdomen is soft.     Tenderness: There is abdominal tenderness.     Comments: Soft, nondistended, generalized tenderness throughout abdomen, but more focal to right lower quadrant.  Negative McBurney's, negative rebound  Musculoskeletal:     Cervical back: Neck supple.  Skin:    General: Skin is warm and dry.  Neurological:     Mental Status: She is alert.      UC Treatments / Results  Labs (all labs ordered are listed, but only abnormal results are displayed) Labs Reviewed  SARS CORONAVIRUS 2 (TAT 6-24 HRS)    EKG   Radiology No results found.  Procedures Procedures (including critical care time)  Medications Ordered in UC Medications - No data to display  Initial Impression / Assessment and Plan / UC Course  I have reviewed the triage vital signs and the nursing notes.  Pertinent labs & imaging results that were available during my care of the patient were reviewed by me and considered in my medical decision making (see chart for details).      Symptoms most likely viral etiology, Covid PCR pending.  Less suspicious of appendicitis at this time, but discussed with patient if developing increased pain to her right lower quadrant to follow-up in emergency room.  Treating symptomatically and supportively at this time.  Rest and fluids.  Discussed strict return precautions. Patient verbalized understanding and is agreeable with plan.  Final Clinical Impressions(s) / UC Diagnoses   Final diagnoses:  Viral illness     Discharge Instructions     Covid swab pending, monitor my chart for results Please use Zofran as needed for nausea, please drink plenty of fluids If having really frequent bowel movements may use Imodium or Pepto-Bismol Tylenol and ibuprofen for headache, body aches and any fevers  Please follow-up if developing any worsening or persistent symptoms, worsening abdominal pain, dizziness, lightheadedness   ED Prescriptions    Medication Sig Dispense Auth. Provider   ondansetron (ZOFRAN ODT) 4 MG disintegrating tablet Take 1 tablet (4 mg total) by mouth every 8 (eight) hours as needed for nausea or vomiting. 20 tablet Cena Bruhn, [place] C, PA-C     PDMP not reviewed this encounter.   Lew Dawes, [place] 06/09/19 [phone]

## 2019-06-09 NOTE — Discharge Instructions (Signed)
Covid swab pending, monitor my chart for results Please use Zofran as needed for nausea, please drink plenty of fluids If having really frequent bowel movements may use Imodium or Pepto-Bismol Tylenol and ibuprofen for headache, body aches and any fevers  Please follow-up if developing any worsening or persistent symptoms, worsening abdominal pain, dizziness, lightheadedness

## 2019-06-10 LAB — SARS CORONAVIRUS 2 (TAT 6-24 HRS): SARS Coronavirus 2: NEGATIVE

## 2019-07-02 DIAGNOSIS — E559 Vitamin D deficiency, unspecified: Secondary | ICD-10-CM | POA: Insufficient documentation

## 2019-09-20 ENCOUNTER — Other Ambulatory Visit: Payer: Self-pay

## 2019-09-25 ENCOUNTER — Encounter (HOSPITAL_COMMUNITY): Payer: Self-pay | Admitting: Emergency Medicine

## 2019-09-25 ENCOUNTER — Emergency Department (HOSPITAL_COMMUNITY)
Admission: EM | Admit: 2019-09-25 | Discharge: 2019-09-25 | Disposition: A | Discharge status: Home / Self Care | Payer: Self-pay | Attending: Emergency Medicine | Admitting: Emergency Medicine

## 2019-09-25 ENCOUNTER — Ambulatory Visit (HOSPITAL_COMMUNITY): Admission: EM | Admit: 2019-09-25 | Discharge: 2019-09-25 | Discharge status: Against Medical Advice | Payer: Self-pay

## 2019-09-25 ENCOUNTER — Other Ambulatory Visit: Payer: Self-pay

## 2019-09-25 DIAGNOSIS — R300 Dysuria: Secondary | ICD-10-CM | POA: Insufficient documentation

## 2019-09-25 DIAGNOSIS — M545 Low back pain: Secondary | ICD-10-CM | POA: Insufficient documentation

## 2019-09-25 DIAGNOSIS — Z5321 Procedure and treatment not carried out due to patient leaving prior to being seen by health care provider: Secondary | ICD-10-CM | POA: Insufficient documentation

## 2019-09-25 LAB — URINALYSIS, ROUTINE W REFLEX MICROSCOPIC
Bacteria, UA: NONE SEEN
Bilirubin Urine: NEGATIVE
Glucose, UA: NEGATIVE mg/dL
Ketones, ur: NEGATIVE mg/dL
Nitrite: NEGATIVE
Protein, ur: 100 mg/dL — AB
Specific Gravity, Urine: 1.009 (ref 1.005–1.030)
pH: 5 (ref 5.0–8.0)

## 2019-09-25 NOTE — ED Triage Notes (Signed)
Patient here from home reporting mid to lower back pain x2 weeks. Yesterday started to notice pain and burning with urination.

## 2019-10-28 IMAGING — CT CT ABD-PELV W/ CM
2 of 5 series · 16 of 46 positions shown, 18 images · IV contrast (ISOVUE)
Comparison: None.

CLINICAL DATA: Lower abdominal pain with diarrhea and weight loss
for 10 days.

EXAM:
CT ABDOMEN AND PELVIS WITH CONTRAST
TECHNIQUE: Multidetector CT imaging of the abdomen and pelvis was performed
using the standard protocol following bolus administration of
intravenous contrast.
CONTRAST:  75mL OMNIPAQUE IOHEXOL 300 MG/ML  SOLN

[Series 2: axial st · axial · 0.64mm/px · z∈[+1096,+1440]mm · 13 of 81 slices shown, 15 images]
[im 6/81  soft-tissue]
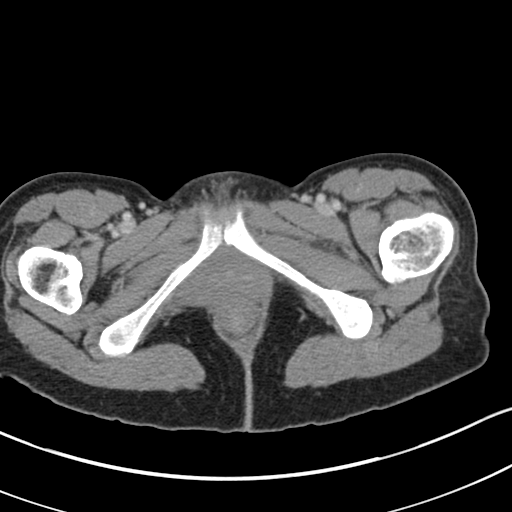
[im 6/81  bone]
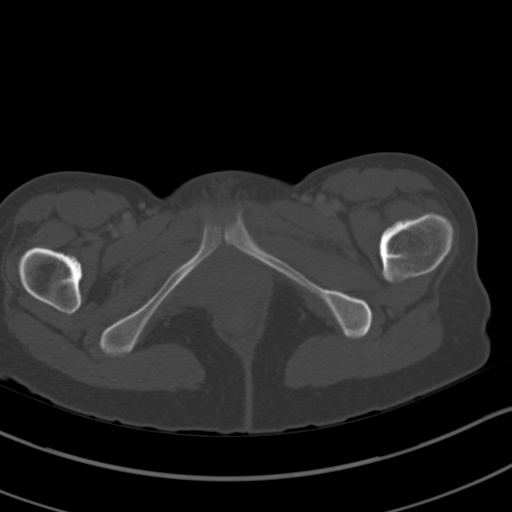
[im 12/81  soft-tissue]
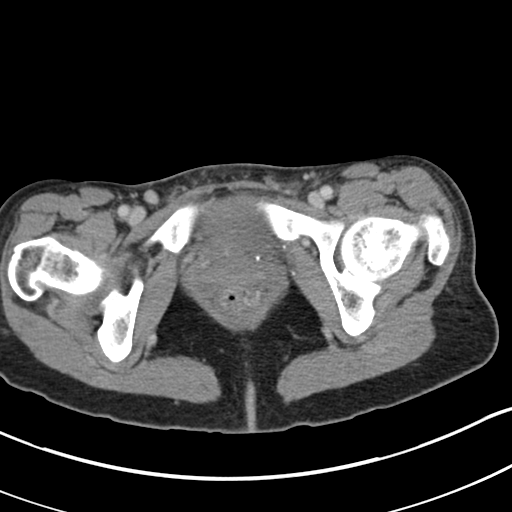
[im 18/81  soft-tissue]
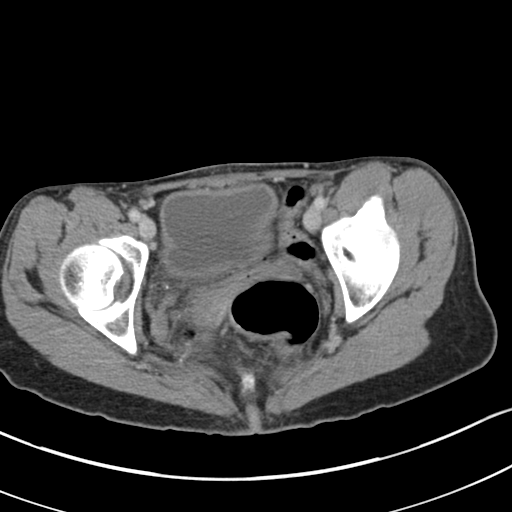
[im 23/81  soft-tissue]
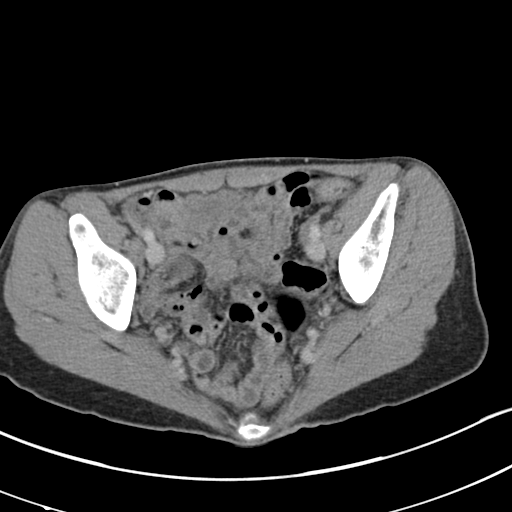
[im 29/81  soft-tissue]
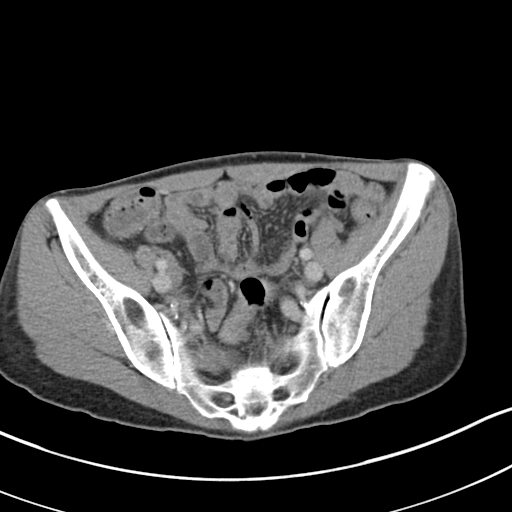
[im 35/81  soft-tissue]
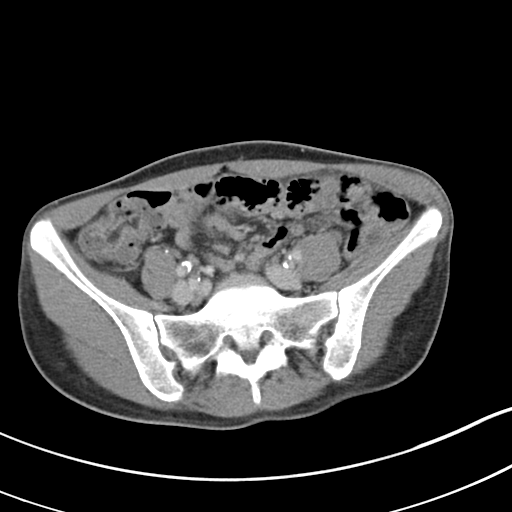
[im 41/81  soft-tissue]
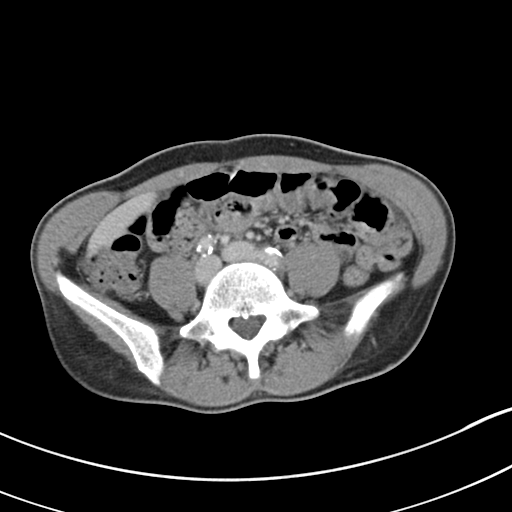
[im 46/81  soft-tissue]
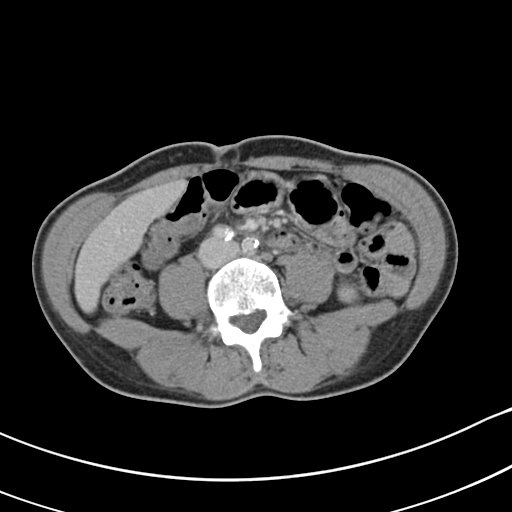
[im 52/81  soft-tissue]
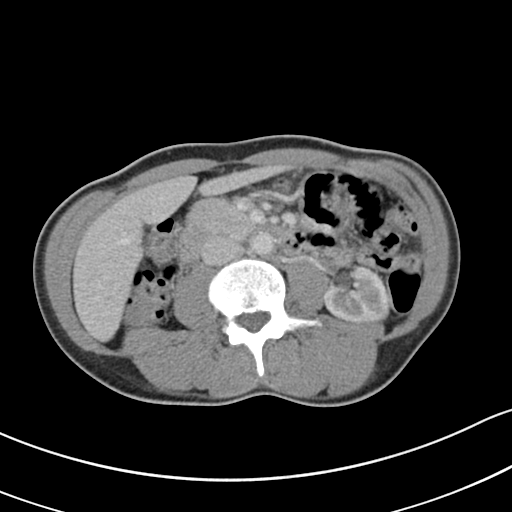
[im 52/81  bone]
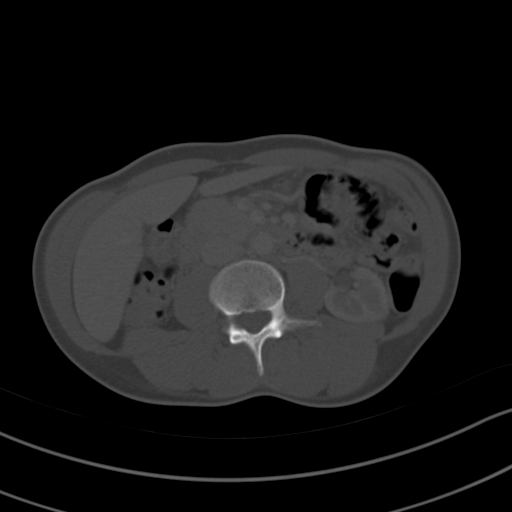
[im 58/81  soft-tissue]
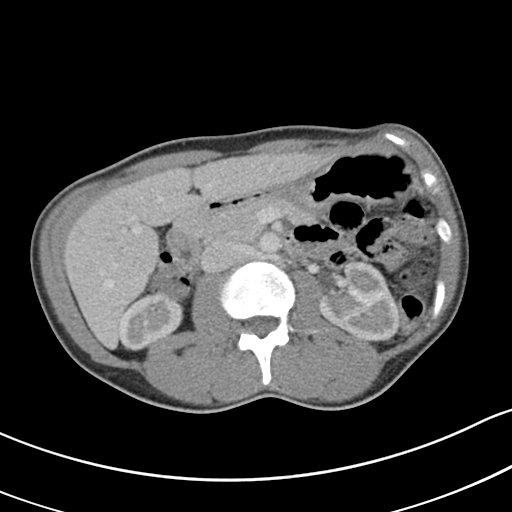
[im 63/81  soft-tissue]
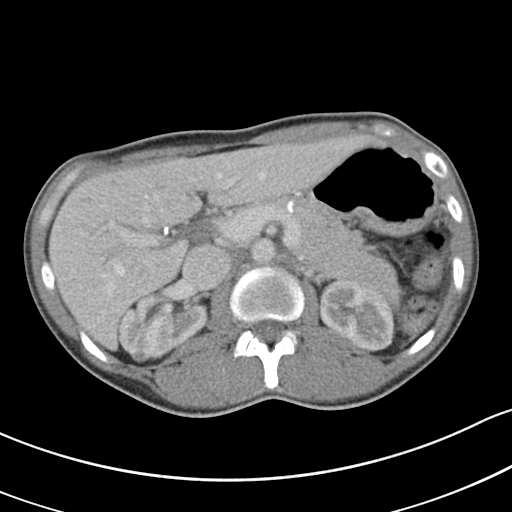
[im 69/81  soft-tissue]
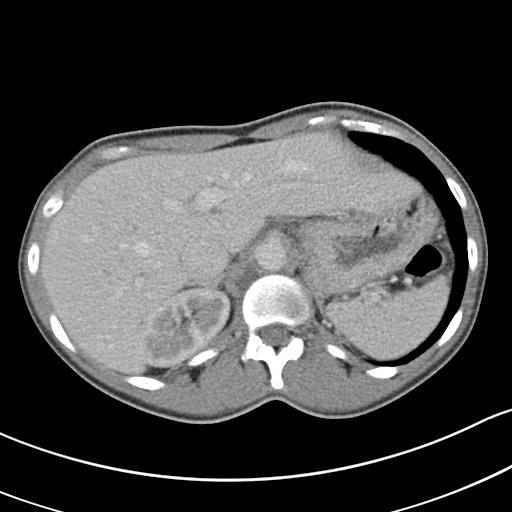
[im 75/81  soft-tissue]
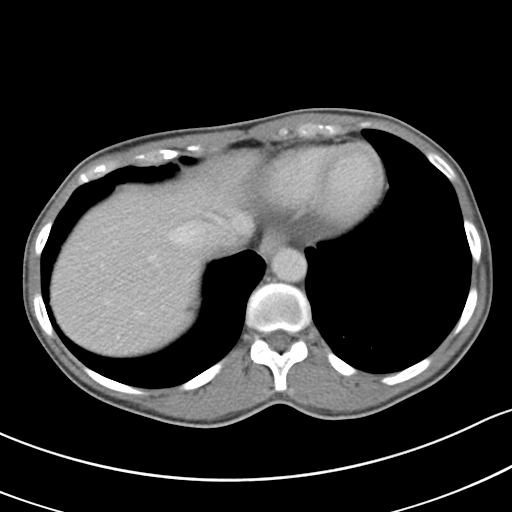

[Series 4: coronal st · coronal · 0.56mm/px · 3 of 62 slices shown]
[im 21/62  soft-tissue]
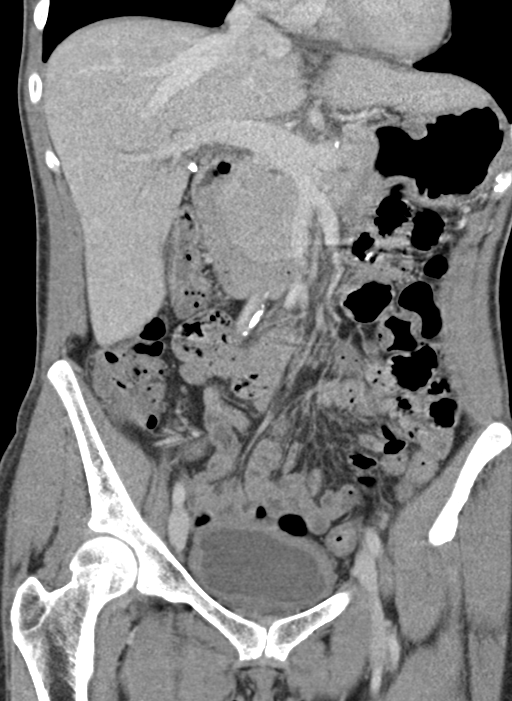
[im 28/62  soft-tissue]
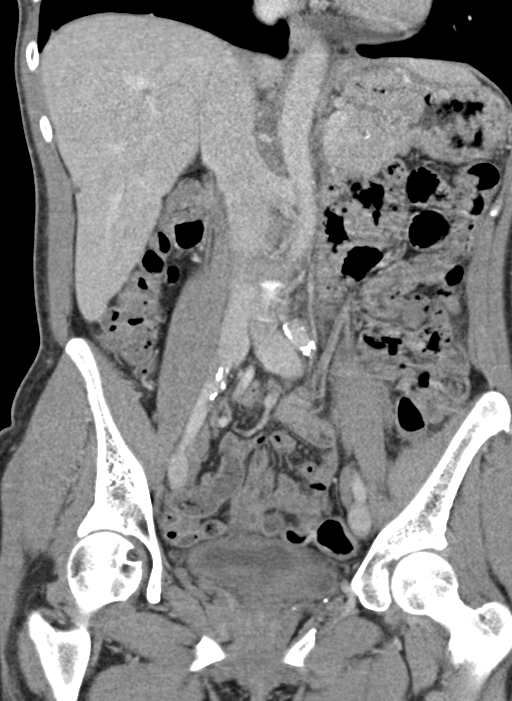
[im 34/62  soft-tissue]
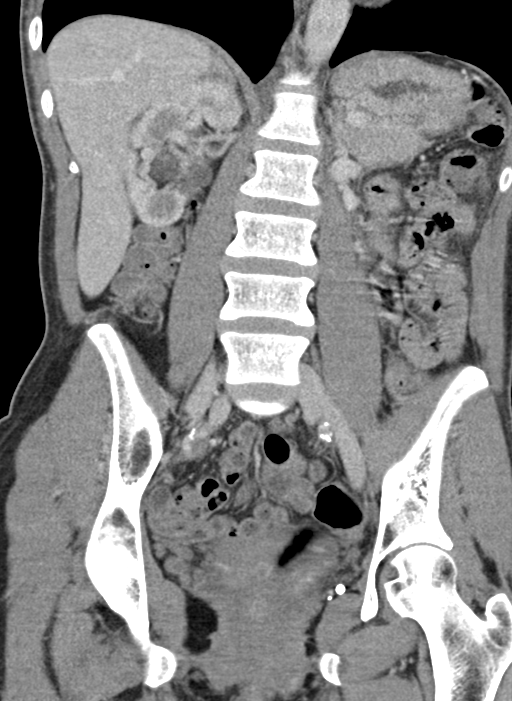

[16 of 46 positions shown; findings below may reference images not displayed]

FINDINGS: Lower chest: No acute abnormality.

Hepatobiliary: 7 mm hypodensity in the right hepatic lobe likely to
represent a small cyst or hemangioma. No biliary dilatation or
enhancing mass lesions. The patient is status post cholecystectomy.

Pancreas: Normal

Spleen: Normal

Adrenals/Urinary Tract: Normal bilateral adrenal glands. Tiny too
small to characterize exophytic hypodensity off the upper pole of
the right kidney is identified measuring 6 mm, statistically
consistent with a cyst. No hydroureteronephrosis. Urinary bladder is
somewhat thick-walled in appearance circumferentially raising
suspicion for cystitis.

Stomach/Bowel: Stomach is within normal limits. Appendix appears
normal. No evidence of bowel wall thickening, distention, or
inflammatory changes.

Vascular/Lymphatic: Moderate aortoiliac atherosclerosis without
aneurysm. No lymphadenopathy by CT size criteria.

Reproductive: Hysterectomy.  No adnexal mass.

Other: Small periumbilical fat containing hernia. No free air nor
free fluid.

Musculoskeletal: No aggressive osseous lesions or fracture.
IMPRESSION: 1. Circumferentially thickened appearance of the urinary bladder
question cystitis.
2. Subcentimeter hypodensities in the right hepatic lobe and right
kidney statistically consistent with cysts.
3. No bowel obstruction, mural thickening or inflammatory change to
explain the patient's diarrhea.

## 2020-07-18 IMAGING — DX PORTABLE CHEST - 1 VIEW
1 series · 1 of 1 positions shown · non-contrast
Comparison: None.

CLINICAL DATA: Chest pain

EXAM:
PORTABLE CHEST 1 VIEW

[chest ap]
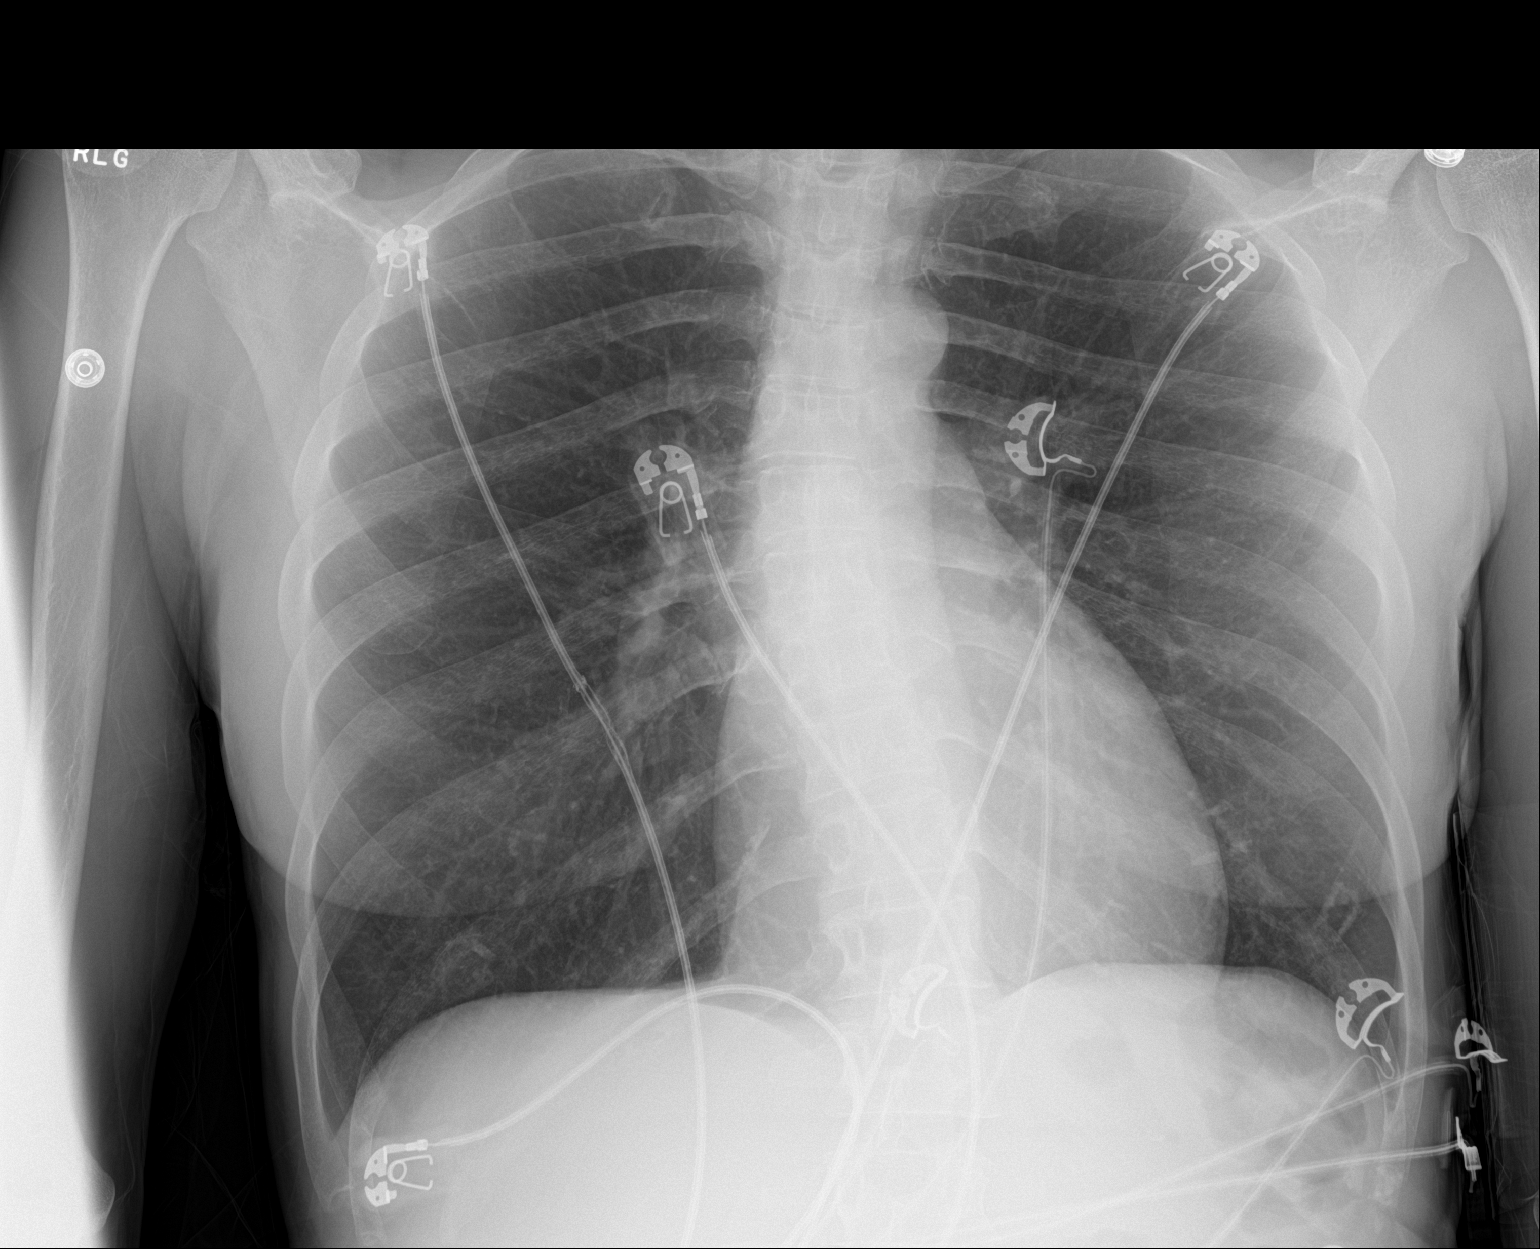

[1 of 1 positions shown; findings below may reference images not displayed]

FINDINGS: Lungs are clear. Heart size and pulmonary vascularity are normal. No
adenopathy. No pneumothorax. There is thoracolumbar levoscoliosis.
IMPRESSION: No edema or consolidation.

## 2020-08-04 ENCOUNTER — Ambulatory Visit
Admission: EM | Admit: 2020-08-04 | Discharge: 2020-08-04 | Disposition: A | Discharge status: Home / Self Care | Payer: BLUE CROSS/BLUE SHIELD

## 2020-08-04 ENCOUNTER — Other Ambulatory Visit: Payer: Self-pay

## 2020-08-04 NOTE — Discharge Instructions (Addendum)
Left without being seen.

## 2020-08-04 NOTE — ED Triage Notes (Signed)
Pt c/o RLQ pain radiating to rt flank pain with nausea since Wednesday. States having pain on any movement. Denies urinary sx's.

## 2020-08-04 NOTE — ED Notes (Signed)
Pt came out of room states she is leaving d/t wait time. Pt notified she is next and decided to leave any ways.

## 2020-08-09 DIAGNOSIS — I129 Hypertensive chronic kidney disease with stage 1 through stage 4 chronic kidney disease, or unspecified chronic kidney disease: Secondary | ICD-10-CM | POA: Insufficient documentation

## 2020-08-22 DIAGNOSIS — I129 Hypertensive chronic kidney disease with stage 1 through stage 4 chronic kidney disease, or unspecified chronic kidney disease: Secondary | ICD-10-CM | POA: Insufficient documentation

## 2021-03-20 DIAGNOSIS — Z87448 Personal history of other diseases of urinary system: Secondary | ICD-10-CM | POA: Insufficient documentation

## 2021-05-22 DIAGNOSIS — R079 Chest pain, unspecified: Secondary | ICD-10-CM | POA: Insufficient documentation

## 2021-05-22 DIAGNOSIS — R9431 Abnormal electrocardiogram [ECG] [EKG]: Secondary | ICD-10-CM | POA: Insufficient documentation

## 2021-05-22 DIAGNOSIS — Z6820 Body mass index (BMI) 20.0-20.9, adult: Secondary | ICD-10-CM | POA: Insufficient documentation

## 2021-06-05 DIAGNOSIS — F17210 Nicotine dependence, cigarettes, uncomplicated: Secondary | ICD-10-CM | POA: Insufficient documentation

## 2021-07-05 DIAGNOSIS — R0600 Dyspnea, unspecified: Secondary | ICD-10-CM | POA: Insufficient documentation

## 2021-07-05 DIAGNOSIS — Z823 Family history of stroke: Secondary | ICD-10-CM | POA: Insufficient documentation

## 2021-11-20 DIAGNOSIS — I2510 Atherosclerotic heart disease of native coronary artery without angina pectoris: Secondary | ICD-10-CM | POA: Insufficient documentation

## 2022-06-04 ENCOUNTER — Encounter: Payer: Self-pay | Admitting: Gastroenterology

## 2022-06-04 ENCOUNTER — Telehealth: Payer: Self-pay | Admitting: Gastroenterology

## 2022-06-04 NOTE — Telephone Encounter (Signed)
Hi Dr. Adela Lank,  Supervising Provide: 06/04/2022- AM   We received a referral for patient to have a colonoscopy, she does have GI history with [facility] referral notes were sent and scanned into Media for review.   Thank you

## 2022-06-04 NOTE — Telephone Encounter (Signed)
There are no prior GI notes in her chart available for review, only see referral from her primary care.  She does have stage IV CKD and other medical problems, may be best to see her in the office first to discuss what she needs and when.  Thanks

## 2022-09-05 NOTE — Progress Notes (Signed)
HPI :  57 y/o female with a history of stage IV CKD - GFR 14/9 based on labs from 06/2022, HTN, vitamin D deficiency, here to establish care to discuss having a colonoscopy.  She last had a colonoscopy in 2008 for diarrhea at the time. Changes of a self limited coliits were noted on pathology. Formal report of the exam not available but no polyps noted on path.   She denies any problems with her bowels. No diarrhea / constipation. No blood in her stools. No abdominal pains. No digestive complaints. No GERD. No FH of colon cancer.   She denies any cardiopulmonary symptoms. She had a stress test and echo in 08/2021 which looked okay.   Her main active health issue is CKD, stage IV. She has declined kidney biopsy in the past, unclear what is causing this. She states she was told she may need to start dialysis in the next year. She is followed by Dr. Melanee Spry of [facility].  She denies any issues with anesthesia in the past.    Colonoscopy 06/2006 -  biopsies taken for microscopic colitis shows "patchy mucosal fibrosis suspicious for resolving self limited colitis". No report on file of endoscopic findings.   Exercise tolerance test - 09/12/21 -   Echo 09/12/21 - EF 55-60%, grade I DD, mild TR   CT scan abdomen / pelvis 11/26/2017: IMPRESSION: 1. Circumferentially thickened appearance of the urinary bladder question cystitis. 2. Subcentimeter hypodensities in the right hepatic lobe and right kidney statistically consistent with cysts. 3. No bowel obstruction, mural thickening or inflammatory change to explain the patient's diarrhea.  Labs reviewed in [facility] - Hgb 12.9 in 03/2022    Past Medical History:  Diagnosis Date   CKD (chronic kidney disease) 11/19/2017   stage 4   Coronary artery calcification    Hypertension    STD (female)    Vitamin D deficiency      Past Surgical History:  Procedure Laterality Date   ABDOMINAL HYSTERECTOMY     CARPAL TUNNEL RELEASE      in [place]   CHOLECYSTECTOMY     Family History  Problem Relation Age of Onset   Hypertension Mother    Diabetes Mother    Cancer Mother    Stomach cancer Neg Hx    Esophageal cancer Neg Hx    Colon cancer Neg Hx    Colon polyps Neg Hx    Social History   Tobacco Use   Smoking status: Every Day    Current packs/day: 0.50    Average packs/day: 0.5 packs/day for 33.0 years (16.5 ttl pk-yrs)    Types: Cigarettes   Smokeless tobacco: Never  Vaping Use   Vaping status: Never Used  Substance Use Topics   Alcohol use: Never   Drug use: Never   Current Outpatient Medications  Medication Sig Dispense Refill   amLODipine (NORVASC) 10 MG tablet Take 10 mg by mouth daily.      empagliflozin (JARDIANCE) 10 MG TABS tablet Take 10 mg by mouth daily.     losartan (COZAAR) 100 MG tablet Take 1 tablet (100 mg total) by mouth daily. 30 tablet 5   ondansetron (ZOFRAN ODT) 4 MG disintegrating tablet Take 1 tablet (4 mg total) by mouth every 8 (eight) hours as needed for nausea or vomiting. 20 tablet 0   No current facility-administered medications for this visit.   No Known Allergies   Review of Systems: All systems reviewed and negative except where noted in HPI.  Labs reviewed in Care everywhere  Physical Exam: BP 118/70   Pulse 85   Ht 5' 6.5" (1.689 m)   Wt 126 lb 6.4 oz (57.3 kg)   SpO2 99%   BMI 20.10 kg/m  Constitutional: Pleasant,well-developed, female in no acute distress. HEENT: Normocephalic and atraumatic. Conjunctivae are normal. No scleral icterus. Neck supple.  Cardiovascular: Normal rate, regular rhythm.  Pulmonary/chest: Effort normal and breath sounds normal. Abdominal: Soft, nondistended, nontender.  There are no masses palpable. Extremities: no edema Lymphadenopathy: No cervical adenopathy noted. Neurological: Alert and oriented to person place and time. Skin: Skin is warm and dry. No rashes noted. Psychiatric: Normal mood and affect. Behavior is  normal.   ASSESSMENT: 57 y.o. female here for assessment of the following  1. Colon cancer screening   2. Stage 4 chronic kidney disease (HCC)    She is overdue for routine colon cancer screening. She has no bowel symptoms that bother her, no alarm symptoms. No family history of colon cancer. We discussed ways to screen for colon cancer - stool based testing vs. Optical colonoscopy vs. Other. If she can have a colonoscopy that would be her preference. It sounds like her kidney disease is advancing recently. I will reach out to her Nephrologist Dr. Melanee Spry to see if he would be okay with the patient doing a bowel prep for colonoscopy. If so, then we will proceed. If they have reservations about that could consider stool testing or delay colonoscopy until she is cleared to have it done. She is not on HD now, but if she goes on HD her case would then need to be done at the hospital for anesthesia support. We discussed all of these issues, she understands. Will hope to hear back from her nephrologist soon and if they are okay with [place] proceeding with colonoscopy we will call her to be scheduled. We will touch base with her either way in a few weeks. She agrees.   Harlin Rain, MD [facility]  CC: Genice Rouge, DO

## 2022-09-06 ENCOUNTER — Ambulatory Visit (INDEPENDENT_AMBULATORY_CARE_PROVIDER_SITE_OTHER): Payer: BLUE CROSS/BLUE SHIELD | Admitting: Gastroenterology

## 2022-09-06 ENCOUNTER — Encounter: Payer: Self-pay | Admitting: Gastroenterology

## 2022-09-06 VITALS — BP 118/70 | HR 85 | Ht 66.5 in | Wt 126.4 lb

## 2022-09-06 DIAGNOSIS — Z1211 Encounter for screening for malignant neoplasm of colon: Secondary | ICD-10-CM

## 2022-09-06 DIAGNOSIS — N184 Chronic kidney disease, stage 4 (severe): Secondary | ICD-10-CM

## 2022-09-06 NOTE — Patient Instructions (Addendum)
_______________________________________________________  If your blood pressure at your visit was 140/90 or greater, please contact your primary care physician to follow up on this.  _______________________________________________________  If you are age 56 or older, your body mass index should be between 23-30. Your Body mass index is 20.1 kg/m. If this is out of the aforementioned range listed, please consider follow up with your Primary Care Provider.  If you are age 74 or younger, your body mass index should be between 19-25. Your Body mass index is 20.1 kg/m. If this is out of the aformentioned range listed, please consider follow up with your Primary Care Provider.   ________________________________________________________  The [facility] providers would like to encourage you to use [facility] to communicate with providers for non-urgent requests or questions.  Due to long hold times on the telephone, sending your provider a message by [facility] may be a faster and more efficient way to get a response.  Please allow 48 business hours for a response.  Please remember that this is for non-urgent requests.  _______________________________________________________  We will reach out to Dr. Valentino Nose at [facility] to request clearance for you to have a colonoscopy. We will be in touch once we have heard back.  Thank you for entrusting me with your care and for choosing [facility], Dr. Ileene Patrick

## 2022-09-10 ENCOUNTER — Telehealth: Payer: Self-pay

## 2022-09-10 NOTE — Telephone Encounter (Signed)
09-06-22:  Letter faxed to Dr. Valentino Nose at [facility] [phone] to request clearance for patient to have colonoscopy prep in light of her kidney disease.  Called Dr. Valentino Nose' office today [phone]) to check on status of request.  LM for Shaqueena to call back to discuss

## 2022-09-16 NOTE — Telephone Encounter (Signed)
Karen Mccall, thanks Jan. Can you let the patient know that her nephrologist is okay for her to proceed with colonoscopy? We can book her in the [facility]. Thanks

## 2022-09-16 NOTE — Telephone Encounter (Signed)
Received fax from Dr. Valentino Nose: No concerns for colonoscopy from nephrology perspective. Cannot provide clearance regarding any other potential concerns." Signed by Dr. Valentino Nose

## 2022-09-16 NOTE — Telephone Encounter (Signed)
Called [facility], Dr. Darla Lesches office. Was transferred to [facility]. Left another message asking for response to letter faxed last week

## 2022-09-18 NOTE — Telephone Encounter (Signed)
Called and spoke to patient.  Scheduled her for a colonoscopy on Monday, 8-26.  She is not on BT or O2.  Scheduled PV for Wed, 8-21.  Patient understands to come to the 2nd floor on 8-21 and 4th floor on 8-26.  She was instructed to ensure she has a driver on 1-61

## 2022-09-25 DIAGNOSIS — Z411 Encounter for cosmetic surgery: Secondary | ICD-10-CM | POA: Insufficient documentation

## 2022-09-25 DIAGNOSIS — L700 Acne vulgaris: Secondary | ICD-10-CM | POA: Insufficient documentation

## 2022-10-09 ENCOUNTER — Ambulatory Visit (AMBULATORY_SURGERY_CENTER): Payer: BLUE CROSS/BLUE SHIELD

## 2022-10-09 VITALS — Ht 66.0 in | Wt 127.0 lb

## 2022-10-09 DIAGNOSIS — Z1211 Encounter for screening for malignant neoplasm of colon: Secondary | ICD-10-CM

## 2022-10-09 NOTE — Progress Notes (Signed)
Pre visit completed via phone call; Patient verified name, DOB, and address;  No egg or soy allergy known to patient;  No issues known to pt with past sedation with any surgeries or procedures; Patient denies ever being told they had issues or difficulty with intubation;  No FH of Malignant Hyperthermia; Pt is not on diet pills; Pt is not on home 02;  Pt is not on blood thinners; Pt denies issues with constipation;  No A fib or A flutter;  Have any cardiac testing pending--NO Insurance verified during PV appt--- BCBS  Pt can ambulate without assistance;  Pt denies use of chewing tobacco; Discussed diabetic/weight loss medication holds; Discussed NSAID holds; Checked BMI to be less than 50; Pt instructed to use Single[employer] or GoodRx for a price reduction on prep;  Patient's chart reviewed by Cathlyn Parsons CNRA prior to previsit and patient appropriate for the [facility].  Pre visit completed and red dot placed by patient's name on their procedure day (on provider's schedule).    Instructions sent to patient via MyChart per her request;

## 2022-10-10 ENCOUNTER — Encounter: Payer: Self-pay | Admitting: Gastroenterology

## 2022-10-12 ENCOUNTER — Encounter: Payer: Self-pay | Admitting: Certified Registered Nurse Anesthetist

## 2022-10-14 ENCOUNTER — Encounter: Payer: Self-pay | Admitting: Gastroenterology

## 2022-10-14 ENCOUNTER — Ambulatory Visit (AMBULATORY_SURGERY_CENTER): Payer: BLUE CROSS/BLUE SHIELD | Admitting: Gastroenterology

## 2022-10-14 VITALS — BP 134/93 | HR 85 | Temp 98.2°F | Resp 20 | Ht 66.0 in | Wt 127.0 lb

## 2022-10-14 DIAGNOSIS — D124 Benign neoplasm of descending colon: Secondary | ICD-10-CM | POA: Diagnosis not present

## 2022-10-14 DIAGNOSIS — Z1211 Encounter for screening for malignant neoplasm of colon: Secondary | ICD-10-CM

## 2022-10-14 MED ORDER — SODIUM CHLORIDE 0.9 % IV SOLN
500.0000 mL | INTRAVENOUS | Status: DC
Start: 2022-10-14 — End: 2022-10-14

## 2022-10-14 NOTE — Op Note (Signed)
[facility] Patient Name: Karen Mccall Procedure Date: 10/14/2022 1:48 PM MRN: [id number] Endoscopist: Viviann Spare P. Adela Lank , MD, [id number] Age: 57 Referring MD:  Date of Birth: Dec 06, 1965 Gender: Female Account #: [id number] Procedure:                Colonoscopy Indications:              Screening for colorectal malignant neoplasm Medicines:                Monitored Anesthesia Care Procedure:                Pre-Anesthesia Assessment:                           - Prior to the procedure, a History and Physical                            was performed, and patient medications and                            allergies were reviewed. The patient's tolerance of                            previous anesthesia was also reviewed. The risks                            and benefits of the procedure and the sedation                            options and risks were discussed with the patient.                            All questions were answered, and informed consent                            was obtained. Prior Anticoagulants: The patient has                            taken no anticoagulant or antiplatelet agents. ASA                            Grade Assessment: III - A patient with severe                            systemic disease. After reviewing the risks and                            benefits, the patient was deemed in satisfactory                            condition to undergo the procedure.                           After obtaining informed consent, the colonoscope  was passed under direct vision. Throughout the                            procedure, the patient's blood pressure, pulse, and                            oxygen saturations were monitored continuously. The                            [id number]-[id number] Colonoscope [id number] was introduced                            through the anus and advanced to the the cecum,                            identified  by appendiceal orifice and ileocecal                            valve. The colonoscopy was performed without                            difficulty. The patient tolerated the procedure                            well. The quality of the bowel preparation was                            good. The ileocecal valve, appendiceal orifice, and                            rectum were photographed. Scope In: 1:56:48 PM Scope Out: 2:19:48 PM Scope Withdrawal Time: 0 hours 18 minutes 8 seconds  Total Procedure Duration: 0 hours 23 minutes 0 seconds  Findings:                 The perianal and digital rectal examinations were                            normal.                           A 3 mm polyp was found in the descending colon. The                            polyp was sessile. The polyp was removed with a                            cold snare. Resection and retrieval were complete.                           Internal hemorrhoids were found during                            retroflexion. The hemorrhoids were moderate.  The exam was otherwise without abnormality. Complications:            No immediate complications. Estimated blood loss:                            Minimal. Estimated Blood Loss:     Estimated blood loss was minimal. Impression:               - One 3 mm polyp in the descending colon, removed                            with a cold snare. Resected and retrieved.                           - Internal hemorrhoids.                           - The examination was otherwise normal. Recommendation:           - Patient has a contact number available for                            emergencies. The signs and symptoms of potential                            delayed complications were discussed with the                            patient. Return to normal activities tomorrow.                            Written discharge instructions were provided to the                             patient.                           - Resume previous diet.                           - Continue present medications.                           - Await pathology results. Viviann Spare P. Adela Lank, MD 10/14/2022 2:24:21 PM This report has been signed electronically.

## 2022-10-14 NOTE — Progress Notes (Signed)
[facility] History and Physical   Primary Care Physician:  Genice Rouge, DO   Reason for Procedure:   Colon cancer screening  Plan:    colonoscopy     HPI: Karen Mccall is a 57 y.o. female  here for colonoscopy screening - first time exam.   . Patient denies any bowel symptoms at this time. No family history of colon cancer known. Otherwise feels well without any cardiopulmonary symptoms.   I have discussed risks / benefits of anesthesia and endoscopic procedure with Dorena Cookey and they wish to proceed with the exams as outlined today.    Past Medical History:  Diagnosis Date   CKD (chronic kidney disease) 11/19/2017   stage 4   Coronary artery calcification    Hyperlipidemia    on meds   Hypertension    on meds   STD (female)    Vitamin D deficiency     Past Surgical History:  Procedure Laterality Date   ABDOMINAL HYSTERECTOMY     CARPAL TUNNEL RELEASE     in [place]   CHOLECYSTECTOMY     COLONOSCOPY  2008   DILATION AND CURETTAGE OF UTERUS  1989   TUBAL LIGATION  1997   WISDOM TOOTH EXTRACTION      Prior to Admission medications   Medication Sig Start Date End Date Taking? Authorizing Provider  atorvastatin (LIPITOR) 20 MG tablet Take 20 mg by mouth every morning.   Yes [provider]  empagliflozin (JARDIANCE) 10 MG TABS tablet Take 10 mg by mouth daily.   Yes [provider]  losartan (COZAAR) 100 MG tablet Take 1 tablet (100 mg total) by mouth daily. 01/09/18  Yes Henson, Vickie L, NP-C  metoprolol succinate (TOPROL-XL) 25 MG 24 hr tablet Take 25 mg by mouth daily.   Yes [provider]  sodium bicarbonate 650 MG tablet Take 650 mg by mouth daily.   Yes [provider]  promethazine (PHENERGAN) 25 MG tablet Take 25 mg by mouth every 6 (six) hours as needed.    [provider]  Ibuprofen-diphenhydrAMINE HCl (ADVIL PM) 200-25 MG CAPS Take 2 tablets by mouth at bedtime as needed (sleep).   06/09/19  [provider]    Current Outpatient Medications  Medication Sig Dispense Refill   atorvastatin (LIPITOR) 20 MG tablet Take 20 mg by mouth every morning.     empagliflozin (JARDIANCE) 10 MG TABS tablet Take 10 mg by mouth daily.     losartan (COZAAR) 100 MG tablet Take 1 tablet (100 mg total) by mouth daily. 30 tablet 5   metoprolol succinate (TOPROL-XL) 25 MG 24 hr tablet Take 25 mg by mouth daily.     sodium bicarbonate 650 MG tablet Take 650 mg by mouth daily.     promethazine (PHENERGAN) 25 MG tablet Take 25 mg by mouth every 6 (six) hours as needed.     Current Facility-Administered Medications  Medication Dose Route Frequency Provider Last Rate Last Admin   0.9 %  sodium chloride infusion  500 mL Intravenous Continuous Tiah Heckel, Willaim Rayas, MD        Allergies as of 10/14/2022   (No Known Allergies)    Family History  Problem Relation Age of Onset   Hypertension Mother    Diabetes Mother    Cancer Mother    Stomach cancer Neg Hx    Esophageal cancer Neg Hx    Colon cancer Neg Hx    Colon polyps Neg Hx  Social History   Socioeconomic History   Marital status: Legally Separated    Spouse name: Not on file   Number of children: 5   Years of education: Not on file   Highest education level: Not on file  Occupational History   Occupation: Pharmacy tech  Tobacco Use   Smoking status: Every Day    Current packs/day: 1.00    Average packs/day: 0.8 packs/day for 70.7 years (54.2 ttl pk-yrs)    Types: Cigarettes    Start date: 75   Smokeless tobacco: Never  Vaping Use   Vaping status: Never Used  Substance and Sexual Activity   Alcohol use: Never   Drug use: Never   Sexual activity: Not on file  Other Topics Concern   Not on file  Social History Narrative   Not on file   Social Determinants of Health   Financial Resource Strain: Not on file  Food Insecurity: No Food Insecurity (07/10/2020)   Received from [facility], [facility]    Hunger Vital Sign    Worried About Running Out of Food in the Last Year: Never true    Ran Out of Food in the Last Year: Never true  Transportation Needs: Not on file  Physical Activity: Not on file  Stress: Not on file  Social Connections: Unknown (07/03/2021)   Received from [facility], [facility]   Social Network    Social Network: Not on file  Intimate Partner Violence: Unknown (05/24/2021)   [facility]   HITS    Physically Hurt: Not on file    Insult or Talk Down To: Not on file    Threaten Physical Harm: Not on file    Scream or Curse: Not on file    Review of Systems: All other review of systems negative except as mentioned in the HPI.  Physical Exam: Vital signs BP 123/60   Pulse 89   Temp 98.2 F (36.8 C)   Ht 5\' 6"  (1.676 m)   Wt 127 lb (57.6 kg)   SpO2 99%   BMI 20.50 kg/m   General:   Alert,  Well-developed, pleasant and cooperative in NAD Lungs:  Clear throughout to auscultation.   Heart:  Regular rate and rhythm Abdomen:  Soft, nontender and nondistended.   Neuro/Psych:  Alert and cooperative. Normal mood and affect. A and O x 3  Harlin Rain, MD [facility]

## 2022-10-14 NOTE — Progress Notes (Signed)
1357 BP  182/114, Labetalol given IV, MD update, vss

## 2022-10-14 NOTE — Progress Notes (Signed)
Report given to PACU, vss 

## 2022-10-14 NOTE — Progress Notes (Signed)
Called to room to assist during endoscopic procedure.  Patient ID and intended procedure confirmed with present staff. Received instructions for my participation in the procedure from the performing physician.  

## 2022-10-14 NOTE — Patient Instructions (Signed)
Discharge instructions given. Handouts on polyps and Hemorrhoids. Resume previous medications. YOU HAD AN ENDOSCOPIC PROCEDURE TODAY AT THE [facility]:   Refer to the procedure report that was given to you for any specific questions about what was found during the examination.  If the procedure report does not answer your questions, please call your gastroenterologist to clarify.  If you requested that your care partner not be given the details of your procedure findings, then the procedure report has been included in a sealed envelope for you to review at your convenience later.  YOU SHOULD EXPECT: Some feelings of bloating in the abdomen. Passage of more gas than usual.  Walking can help get rid of the air that was put into your GI tract during the procedure and reduce the bloating. If you had a lower endoscopy (such as a colonoscopy or flexible sigmoidoscopy) you may notice spotting of blood in your stool or on the toilet paper. If you underwent a bowel prep for your procedure, you may not have a normal bowel movement for a few days.  Please Note:  You might notice some irritation and congestion in your nose or some drainage.  This is from the oxygen used during your procedure.  There is no need for concern and it should clear up in a day or so.  SYMPTOMS TO REPORT IMMEDIATELY:  Following lower endoscopy (colonoscopy or flexible sigmoidoscopy):  Excessive amounts of blood in the stool  Significant tenderness or worsening of abdominal pains  Swelling of the abdomen that is new, acute  Fever of 100F or higher   For urgent or emergent issues, a gastroenterologist can be reached at any hour by calling [phone]. Do not use MyChart messaging for urgent concerns.    DIET:  We do recommend a small meal at first, but then you may proceed to your regular diet.  Drink plenty of fluids but you should avoid alcoholic beverages for 24 hours.  ACTIVITY:  You should plan to take it  easy for the rest of today and you should NOT DRIVE or use heavy machinery until tomorrow (because of the sedation medicines used during the test).    FOLLOW UP: Our staff will call the number listed on your records the next business day following your procedure.  We will call around 7:15- 8:00 am to check on you and address any questions or concerns that you may have regarding the information given to you following your procedure. If we do not reach you, we will leave a message.     If any biopsies were taken you will be contacted by phone or by letter within the next 1-3 weeks.  Please call us at [phone] if you have not heard about the biopsies in 3 weeks.    SIGNATURES/CONFIDENTIALITY: You and/or your care partner have signed paperwork which will be entered into your electronic medical record.  These signatures attest to the fact that that the information above on your After Visit Summary has been reviewed and is understood.  Full responsibility of the confidentiality of this discharge information lies with you and/or your care-partner. 

## 2022-10-15 ENCOUNTER — Telehealth: Payer: Self-pay

## 2022-10-15 NOTE — Telephone Encounter (Signed)
  Follow up Call-     10/14/2022    1:30 PM  Call back number  Post procedure Call Back phone  # [phone]  Permission to leave phone message Yes     Patient questions:  Do you have a fever, pain , or abdominal swelling? No. Pain Score  0 *  Have you tolerated food without any problems? Yes.    Have you been able to return to your normal activities? Yes.    Do you have any questions about your discharge instructions: Diet   No. Medications  No. Follow up visit  No.  Do you have questions or concerns about your Care? No.  Actions: * If pain score is 4 or above: No action needed, pain <4.

## 2022-10-22 ENCOUNTER — Encounter: Payer: Self-pay | Admitting: Gastroenterology

## 2023-09-26 ENCOUNTER — Other Ambulatory Visit: Payer: Self-pay | Admitting: Medical Genetics

## 2023-12-15 ENCOUNTER — Other Ambulatory Visit: Payer: Self-pay | Admitting: Medical Genetics

## 2023-12-15 DIAGNOSIS — Z006 Encounter for examination for normal comparison and control in clinical research program: Secondary | ICD-10-CM
